# Patient Record
Sex: Female | Born: 1990 | Race: Black or African American | Hispanic: No | Marital: Single | Smoking: Former smoker
Health system: Southern US, Community
[De-identification: ages and names within clinical notes are randomized; demographics above are authoritative.]

## PROBLEM LIST (undated history)

## (undated) ENCOUNTER — Inpatient Hospital Stay (HOSPITAL_COMMUNITY): Payer: Self-pay

## (undated) DIAGNOSIS — K219 Gastro-esophageal reflux disease without esophagitis: Secondary | ICD-10-CM

## (undated) DIAGNOSIS — N200 Calculus of kidney: Secondary | ICD-10-CM

## (undated) DIAGNOSIS — Z87442 Personal history of urinary calculi: Secondary | ICD-10-CM

## (undated) DIAGNOSIS — N201 Calculus of ureter: Secondary | ICD-10-CM

## (undated) DIAGNOSIS — Z973 Presence of spectacles and contact lenses: Secondary | ICD-10-CM

## (undated) DIAGNOSIS — R350 Frequency of micturition: Secondary | ICD-10-CM

---

## 2012-11-11 ENCOUNTER — Inpatient Hospital Stay (HOSPITAL_COMMUNITY)
Admission: AD | Admit: 2012-11-11 | Discharge: 2012-11-11 | Disposition: A | Discharge status: Home / Self Care | Payer: BC Managed Care – PPO | Source: Ambulatory Visit | Attending: Obstetrics and Gynecology | Admitting: Obstetrics and Gynecology

## 2012-11-11 ENCOUNTER — Encounter (HOSPITAL_COMMUNITY): Payer: Self-pay | Admitting: *Deleted

## 2012-11-11 DIAGNOSIS — O209 Hemorrhage in early pregnancy, unspecified: Secondary | ICD-10-CM

## 2012-11-11 DIAGNOSIS — O0932 Supervision of pregnancy with insufficient antenatal care, second trimester: Secondary | ICD-10-CM

## 2012-11-11 LAB — WET PREP, GENITAL
Trich, Wet Prep: NONE SEEN
Yeast Wet Prep HPF POC: NONE SEEN

## 2012-11-11 NOTE — MAU Provider Note (Signed)
Attestation of Attending Supervision of Advanced Practitioner (CNM/NP): Evaluation and management procedures were performed by the Advanced Practitioner under my supervision and collaboration.  I have reviewed the Advanced Practitioner's note and chart, and I agree with the management and plan.  Erin Jefferson,Erin Jefferson 11/11/2012 11:28 AM

## 2012-11-11 NOTE — MAU Note (Signed)
Pt reports she started having some vaginal bleeding last night when she wiped. Still having bleeding this morning denies pain or cramping. Last intercourse Thursday.

## 2012-11-11 NOTE — MAU Provider Note (Signed)
Chief Complaint  Patient presents with  . Vaginal Bleeding    Subjective Erin Jefferson 21 y.o.  G2P1001 at [redacted]w[redacted]d by LMP presents with onset last night of first episode of small amount pink vaginal bleeding. No abdominal pain. Last intercourse 2 nights ago.  Denies abnormal vaginal discharge or irritation. No dysuria or hematuria.  Blood type: unknown NPC  Problem list, past medical history, Ob/Gyn history, surgical history, family history and social history reviewed and updated as appropriate. Pertinent Medical History: [place] Pertinent Ob/Gyn History: [facility]VD w/o complications ([facility]) Pertinent Surgical History: none  Prescriptions prior to admission  Medication Sig Dispense Refill  . prenatal vitamin w/FE, FA (NATACHEW) 29-1 MG CHEW Chew 1 tablet by mouth daily at 12 noon.        No Known Allergies   Objective   Filed Vitals:   11/11/12 0934  BP: 118/67  Pulse: 86  Resp: 18     Physical Exam General: WN/WD in NAD  Abdom: soft, NT DT: 140 External genitalia: normal; BUS neg  SSE: small amount pink blood; cervix with no lesions, appears closed Bimanual: Cervix closed, long; uterus anteverted, NT, 14-16 weeks size; adnexa nontender, no masses  SVE: L/C/H  Lab Results Results for orders placed during the hospital encounter of 11/11/12 (from the past 24 hour(s))  WET PREP, GENITAL     Status: Abnormal   Collection Time    11/11/12 10:02 AM      Result Value Range   Yeast Wet Prep HPF POC NONE SEEN  NONE SEEN   Trich, Wet Prep NONE SEEN  NONE SEEN   Clue Cells Wet Prep HPF POC FEW (*) NONE SEEN   WBC, Wet Prep HPF POC MODERATE (*) NONE SEEN  ABO/RH     Status: None   Collection Time    11/11/12 10:20 AM      Result Value Range   ABO/RH(D) O POS      Ultrasound  No results found.  Assessment 1. Bleeding in early pregnancy   G2P1001 at [redacted]w[redacted]d EGA, probably post-coital spotting   Plan    GC/CT sent. Outpatient [place] scheduled Discharge home See AVS for pt education  EPB and pelvic rest until NOB    Medication List    TAKE these medications       prenatal vitamin w/FE, FA 29-1 MG Chew  Chew 1 tablet by mouth daily at 12 noon.         Follow-up Information   Schedule an appointment as soon as possible for a visit with [facility] [place]. (Call [facility] or choose provider from list below)    Contact information:   [address] [place] [address] [phone]       Erin Jefferson 11/11/2012 9:55 AM

## 2012-11-15 ENCOUNTER — Other Ambulatory Visit (HOSPITAL_COMMUNITY): Payer: Self-pay | Admitting: Obstetrics and Gynecology

## 2012-11-15 ENCOUNTER — Ambulatory Visit (HOSPITAL_COMMUNITY)
Admission: RE | Admit: 2012-11-15 | Discharge: 2012-11-15 | Disposition: A | Discharge status: Home / Self Care | Payer: BC Managed Care – PPO | Source: Ambulatory Visit | Attending: Obstetrics and Gynecology | Admitting: Obstetrics and Gynecology

## 2012-11-15 DIAGNOSIS — Z3689 Encounter for other specified antenatal screening: Secondary | ICD-10-CM | POA: Insufficient documentation

## 2012-11-15 DIAGNOSIS — O209 Hemorrhage in early pregnancy, unspecified: Secondary | ICD-10-CM

## 2012-11-15 DIAGNOSIS — O0930 Supervision of pregnancy with insufficient antenatal care, unspecified trimester: Secondary | ICD-10-CM | POA: Insufficient documentation

## 2013-01-03 ENCOUNTER — Other Ambulatory Visit: Payer: Self-pay | Admitting: Obstetrics and Gynecology

## 2013-01-03 ENCOUNTER — Other Ambulatory Visit (HOSPITAL_COMMUNITY)
Admission: RE | Admit: 2013-01-03 | Discharge: 2013-01-03 | Disposition: A | Discharge status: Home / Self Care | Payer: Medicaid Other | Source: Ambulatory Visit | Attending: Obstetrics and Gynecology | Admitting: Obstetrics and Gynecology

## 2013-01-03 DIAGNOSIS — Z01419 Encounter for gynecological examination (general) (routine) without abnormal findings: Secondary | ICD-10-CM | POA: Insufficient documentation

## 2013-01-03 DIAGNOSIS — Z113 Encounter for screening for infections with a predominantly sexual mode of transmission: Secondary | ICD-10-CM | POA: Insufficient documentation

## 2013-01-03 LAB — OB RESULTS CONSOLE GC/CHLAMYDIA
Chlamydia: NEGATIVE
Gonorrhea: NEGATIVE

## 2013-01-03 LAB — OB RESULTS CONSOLE HIV ANTIBODY (ROUTINE TESTING): HIV: NONREACTIVE

## 2013-01-03 LAB — OB RESULTS CONSOLE HEPATITIS B SURFACE ANTIGEN: Hepatitis B Surface Ag: NEGATIVE

## 2013-02-06 ENCOUNTER — Encounter: Payer: Self-pay | Admitting: Obstetrics

## 2013-04-04 LAB — OB RESULTS CONSOLE GBS: GBS: POSITIVE

## 2013-04-29 ENCOUNTER — Encounter (HOSPITAL_COMMUNITY)
Admission: AD | Disposition: A | Discharge status: Home / Self Care | Payer: Self-pay | Source: Ambulatory Visit | Attending: Obstetrics and Gynecology

## 2013-04-29 ENCOUNTER — Inpatient Hospital Stay (HOSPITAL_COMMUNITY): Payer: Medicaid Other | Admitting: Certified Registered"

## 2013-04-29 ENCOUNTER — Inpatient Hospital Stay (HOSPITAL_COMMUNITY)
Admission: AD | Admit: 2013-04-29 | Discharge: 2013-05-02 | DRG: 766 | Disposition: A | Discharge status: Home / Self Care | Payer: Medicaid Other | Source: Ambulatory Visit | Attending: Obstetrics and Gynecology | Admitting: Obstetrics and Gynecology

## 2013-04-29 ENCOUNTER — Encounter (HOSPITAL_COMMUNITY): Payer: Self-pay

## 2013-04-29 ENCOUNTER — Encounter (HOSPITAL_COMMUNITY): Payer: Self-pay | Admitting: Certified Registered"

## 2013-04-29 DIAGNOSIS — O99892 Other specified diseases and conditions complicating childbirth: Secondary | ICD-10-CM | POA: Diagnosis present

## 2013-04-29 DIAGNOSIS — O98519 Other viral diseases complicating pregnancy, unspecified trimester: Principal | ICD-10-CM | POA: Diagnosis present

## 2013-04-29 DIAGNOSIS — Z22330 Carrier of Group B streptococcus: Secondary | ICD-10-CM

## 2013-04-29 DIAGNOSIS — A6000 Herpesviral infection of urogenital system, unspecified: Secondary | ICD-10-CM | POA: Diagnosis present

## 2013-04-29 DIAGNOSIS — Z98891 History of uterine scar from previous surgery: Secondary | ICD-10-CM

## 2013-04-29 DIAGNOSIS — B009 Herpesviral infection, unspecified: Secondary | ICD-10-CM

## 2013-04-29 LAB — CBC
HCT: 37.2 % (ref 36.0–46.0)
Hemoglobin: 12.9 g/dL (ref 12.0–15.0)
WBC: 5.5 10*3/uL (ref 4.0–10.5)

## 2013-04-29 LAB — TYPE AND SCREEN: Antibody Screen: NEGATIVE

## 2013-04-29 SURGERY — Surgical Case
Anesthesia: Spinal | Site: Abdomen | Wound class: Clean Contaminated

## 2013-04-29 MED ORDER — IBUPROFEN 600 MG PO TABS
600.0000 mg | ORAL_TABLET | Freq: Four times a day (QID) | ORAL | Status: DC
  Administered 2013-04-29 – 2013-05-02 (×10): 600 mg via ORAL
  Filled 2013-04-29 (×10): qty 1

## 2013-04-29 MED ORDER — OXYCODONE-ACETAMINOPHEN 5-325 MG PO TABS
1.0000 | ORAL_TABLET | ORAL | Status: DC | PRN
Start: 2013-04-29 — End: 2013-04-29

## 2013-04-29 MED ORDER — LACTATED RINGERS IV SOLN: INTRAVENOUS | Status: DC

## 2013-04-29 MED ORDER — SCOPOLAMINE 1 MG/3DAYS TD PT72: 1.0000 | MEDICATED_PATCH | Freq: Once | TRANSDERMAL | Status: AC

## 2013-04-29 MED ORDER — DIPHENHYDRAMINE HCL 50 MG/ML IJ SOLN: 12.5000 mg | INTRAMUSCULAR | Status: DC | PRN

## 2013-04-29 MED ORDER — ONDANSETRON HCL 4 MG/2ML IJ SOLN
INTRAMUSCULAR | Status: DC | PRN
  Administered 2013-04-29: 4 mg via INTRAVENOUS

## 2013-04-29 MED ORDER — EPHEDRINE 5 MG/ML INJ: 10.0000 mg | INTRAVENOUS | Status: DC | PRN

## 2013-04-29 MED ORDER — METOCLOPRAMIDE HCL 5 MG/ML IJ SOLN: 10.0000 mg | Freq: Three times a day (TID) | INTRAMUSCULAR | Status: DC | PRN

## 2013-04-29 MED ORDER — MENTHOL 3 MG MT LOZG: 1.0000 | LOZENGE | OROMUCOSAL | Status: DC | PRN

## 2013-04-29 MED ORDER — PHENYLEPHRINE HCL 10 MG/ML IJ SOLN
INTRAMUSCULAR | Status: DC | PRN
  Administered 2013-04-29: 40 ug via INTRAVENOUS
  Administered 2013-04-29: 80 ug via INTRAVENOUS
  Administered 2013-04-29: 40 ug via INTRAVENOUS
  Administered 2013-04-29: 80 ug via INTRAVENOUS
  Administered 2013-04-29 (×2): 40 ug via INTRAVENOUS

## 2013-04-29 MED ORDER — PRENATAL MULTIVITAMIN CH
1.0000 | ORAL_TABLET | Freq: Every day | ORAL | Status: DC
  Administered 2013-04-30 – 2013-05-02 (×3): 1 via ORAL
  Filled 2013-04-29 (×3): qty 1

## 2013-04-29 MED ORDER — LIDOCAINE HCL (PF) 1 % IJ SOLN: 30.0000 mL | INTRAMUSCULAR | Status: DC | PRN

## 2013-04-29 MED ORDER — ONDANSETRON HCL 4 MG PO TABS: 4.0000 mg | ORAL_TABLET | ORAL | Status: DC | PRN

## 2013-04-29 MED ORDER — PHENYLEPHRINE 40 MCG/ML (10ML) SYRINGE FOR IV PUSH (FOR BLOOD PRESSURE SUPPORT)
PREFILLED_SYRINGE | INTRAVENOUS | Status: AC
  Filled 2013-04-29: qty 5

## 2013-04-29 MED ORDER — PHENYLEPHRINE 40 MCG/ML (10ML) SYRINGE FOR IV PUSH (FOR BLOOD PRESSURE SUPPORT): 80.0000 ug | PREFILLED_SYRINGE | INTRAVENOUS | Status: DC | PRN

## 2013-04-29 MED ORDER — NALOXONE HCL 1 MG/ML IJ SOLN: 1.0000 ug/kg/h | INTRAVENOUS | Status: DC | PRN

## 2013-04-29 MED ORDER — LACTATED RINGERS IV SOLN: 500.0000 mL | Freq: Once | INTRAVENOUS | Status: DC

## 2013-04-29 MED ORDER — FERROUS SULFATE 325 (65 FE) MG PO TABS
325.0000 mg | ORAL_TABLET | Freq: Two times a day (BID) | ORAL | Status: DC
  Administered 2013-04-30 – 2013-05-02 (×5): 325 mg via ORAL
  Filled 2013-04-29 (×5): qty 1

## 2013-04-29 MED ORDER — DIBUCAINE 1 % RE OINT: 1.0000 "application " | TOPICAL_OINTMENT | RECTAL | Status: DC | PRN

## 2013-04-29 MED ORDER — MORPHINE SULFATE (PF) 0.5 MG/ML IJ SOLN
INTRAMUSCULAR | Status: DC | PRN
  Administered 2013-04-29: .1 mg via INTRATHECAL

## 2013-04-29 MED ORDER — OXYTOCIN BOLUS FROM INFUSION: 500.0000 mL | INTRAVENOUS | Status: DC

## 2013-04-29 MED ORDER — BUPIVACAINE IN DEXTROSE 0.75-8.25 % IT SOLN
INTRATHECAL | Status: DC | PRN
  Administered 2013-04-29: 1.6 mL via INTRATHECAL

## 2013-04-29 MED ORDER — DIPHENHYDRAMINE HCL 50 MG/ML IJ SOLN: 25.0000 mg | INTRAMUSCULAR | Status: DC | PRN

## 2013-04-29 MED ORDER — CITRIC ACID-SODIUM CITRATE 334-500 MG/5ML PO SOLN
30.0000 mL | ORAL | Status: DC | PRN
  Administered 2013-04-29: 30 mL via ORAL
  Filled 2013-04-29: qty 15

## 2013-04-29 MED ORDER — OXYTOCIN 40 UNITS IN LACTATED RINGERS INFUSION - SIMPLE MED: 62.5000 mL/h | INTRAVENOUS | Status: DC

## 2013-04-29 MED ORDER — METHYLERGONOVINE MALEATE 0.2 MG PO TABS: 0.2000 mg | ORAL_TABLET | ORAL | Status: DC | PRN

## 2013-04-29 MED ORDER — OXYTOCIN 10 UNIT/ML IJ SOLN
INTRAMUSCULAR | Status: AC
  Filled 2013-04-29: qty 4

## 2013-04-29 MED ORDER — EPHEDRINE SULFATE 50 MG/ML IJ SOLN
INTRAMUSCULAR | Status: DC | PRN
  Administered 2013-04-29: 10 mg via INTRAVENOUS
  Administered 2013-04-29: 20 mg via INTRAVENOUS
  Administered 2013-04-29: 10 mg via INTRAVENOUS

## 2013-04-29 MED ORDER — MEASLES, MUMPS & RUBELLA VAC ~~LOC~~ INJ: 0.5000 mL | INJECTION | Freq: Once | SUBCUTANEOUS | Status: DC

## 2013-04-29 MED ORDER — WITCH HAZEL-GLYCERIN EX PADS: 1.0000 "application " | MEDICATED_PAD | CUTANEOUS | Status: DC | PRN

## 2013-04-29 MED ORDER — ONDANSETRON HCL 4 MG/2ML IJ SOLN
INTRAMUSCULAR | Status: AC
  Filled 2013-04-29: qty 2

## 2013-04-29 MED ORDER — IBUPROFEN 600 MG PO TABS: 600.0000 mg | ORAL_TABLET | Freq: Four times a day (QID) | ORAL | Status: DC | PRN

## 2013-04-29 MED ORDER — ONDANSETRON HCL 4 MG/2ML IJ SOLN: 4.0000 mg | Freq: Four times a day (QID) | INTRAMUSCULAR | Status: DC | PRN

## 2013-04-29 MED ORDER — KETOROLAC TROMETHAMINE 30 MG/ML IJ SOLN: 30.0000 mg | Freq: Four times a day (QID) | INTRAMUSCULAR | Status: AC | PRN

## 2013-04-29 MED ORDER — MAGNESIUM HYDROXIDE 400 MG/5ML PO SUSP: 30.0000 mL | ORAL | Status: DC | PRN

## 2013-04-29 MED ORDER — DIPHENHYDRAMINE HCL 25 MG PO CAPS: 25.0000 mg | ORAL_CAPSULE | Freq: Four times a day (QID) | ORAL | Status: DC | PRN

## 2013-04-29 MED ORDER — PENICILLIN G POTASSIUM 5000000 UNITS IJ SOLR
5.0000 10*6.[IU] | Freq: Once | INTRAVENOUS | Status: AC
  Administered 2013-04-29: 5 10*6.[IU] via INTRAVENOUS
  Filled 2013-04-29: qty 5

## 2013-04-29 MED ORDER — DEXTROSE 5 % IV SOLN
2.0000 g | INTRAVENOUS | Status: AC
  Administered 2013-04-29: 2 g via INTRAVENOUS
  Filled 2013-04-29: qty 2

## 2013-04-29 MED ORDER — MEPERIDINE HCL 25 MG/ML IJ SOLN: 6.2500 mg | INTRAMUSCULAR | Status: DC | PRN

## 2013-04-29 MED ORDER — TETANUS-DIPHTH-ACELL PERTUSSIS 5-2.5-18.5 LF-MCG/0.5 IM SUSP
0.5000 mL | Freq: Once | INTRAMUSCULAR | Status: AC
  Administered 2013-05-02: 0.5 mL via INTRAMUSCULAR

## 2013-04-29 MED ORDER — KETOROLAC TROMETHAMINE 60 MG/2ML IM SOLN
INTRAMUSCULAR | Status: AC
  Administered 2013-04-29: 60 mg via INTRAMUSCULAR
  Filled 2013-04-29: qty 2

## 2013-04-29 MED ORDER — FENTANYL CITRATE 0.05 MG/ML IJ SOLN: 25.0000 ug | INTRAMUSCULAR | Status: DC | PRN

## 2013-04-29 MED ORDER — NALBUPHINE HCL 10 MG/ML IJ SOLN
5.0000 mg | INTRAMUSCULAR | Status: DC | PRN
  Filled 2013-04-29: qty 1

## 2013-04-29 MED ORDER — SODIUM CHLORIDE 0.9 % IJ SOLN: 3.0000 mL | INTRAMUSCULAR | Status: DC | PRN

## 2013-04-29 MED ORDER — LACTATED RINGERS IV SOLN
INTRAVENOUS | Status: DC
  Administered 2013-04-29 (×3): via INTRAVENOUS

## 2013-04-29 MED ORDER — MORPHINE SULFATE 0.5 MG/ML IJ SOLN
INTRAMUSCULAR | Status: AC
  Filled 2013-04-29: qty 10

## 2013-04-29 MED ORDER — KETOROLAC TROMETHAMINE 60 MG/2ML IM SOLN: 60.0000 mg | Freq: Once | INTRAMUSCULAR | Status: AC | PRN

## 2013-04-29 MED ORDER — SCOPOLAMINE 1 MG/3DAYS TD PT72
MEDICATED_PATCH | TRANSDERMAL | Status: AC
  Administered 2013-04-29: 1.5 mg via TRANSDERMAL
  Filled 2013-04-29: qty 1

## 2013-04-29 MED ORDER — LANOLIN HYDROUS EX OINT: 1.0000 "application " | TOPICAL_OINTMENT | CUTANEOUS | Status: DC | PRN

## 2013-04-29 MED ORDER — PENICILLIN G POTASSIUM 5000000 UNITS IJ SOLR
2.5000 10*6.[IU] | INTRAVENOUS | Status: DC
  Filled 2013-04-29 (×4): qty 2.5

## 2013-04-29 MED ORDER — FENTANYL CITRATE 0.05 MG/ML IJ SOLN
INTRAMUSCULAR | Status: DC | PRN
  Administered 2013-04-29: 12.5 ug via INTRAVENOUS
  Administered 2013-04-29: 25 ug via INTRAVENOUS
  Administered 2013-04-29: 12.5 ug via INTRAVENOUS
  Administered 2013-04-29: 25 ug via INTRAVENOUS
  Administered 2013-04-29: 12.5 ug via INTRAVENOUS
  Administered 2013-04-29: 12.5 ug via INTRATHECAL

## 2013-04-29 MED ORDER — FENTANYL CITRATE 0.05 MG/ML IJ SOLN
INTRAMUSCULAR | Status: AC
  Filled 2013-04-29: qty 2

## 2013-04-29 MED ORDER — NALOXONE HCL 0.4 MG/ML IJ SOLN: 0.4000 mg | INTRAMUSCULAR | Status: DC | PRN

## 2013-04-29 MED ORDER — DIPHENHYDRAMINE HCL 25 MG PO CAPS: 25.0000 mg | ORAL_CAPSULE | ORAL | Status: DC | PRN

## 2013-04-29 MED ORDER — LACTATED RINGERS IV SOLN: 500.0000 mL | INTRAVENOUS | Status: DC | PRN

## 2013-04-29 MED ORDER — SIMETHICONE 80 MG PO CHEW: 80.0000 mg | CHEWABLE_TABLET | ORAL | Status: DC | PRN

## 2013-04-29 MED ORDER — SIMETHICONE 80 MG PO CHEW
80.0000 mg | CHEWABLE_TABLET | Freq: Three times a day (TID) | ORAL | Status: DC
Start: 2013-04-29 — End: 2013-05-02
  Administered 2013-04-29 – 2013-05-02 (×11): 80 mg via ORAL

## 2013-04-29 MED ORDER — ONDANSETRON HCL 4 MG/2ML IJ SOLN: 4.0000 mg | Freq: Three times a day (TID) | INTRAMUSCULAR | Status: DC | PRN

## 2013-04-29 MED ORDER — METHYLERGONOVINE MALEATE 0.2 MG/ML IJ SOLN
0.2000 mg | INTRAMUSCULAR | Status: DC | PRN
Start: 2013-04-29 — End: 2013-05-02

## 2013-04-29 MED ORDER — FENTANYL 2.5 MCG/ML BUPIVACAINE 1/10 % EPIDURAL INFUSION (WH - ANES): 14.0000 mL/h | INTRAMUSCULAR | Status: DC | PRN

## 2013-04-29 MED ORDER — OXYCODONE-ACETAMINOPHEN 5-325 MG PO TABS
1.0000 | ORAL_TABLET | ORAL | Status: DC | PRN
  Filled 2013-04-29: qty 1

## 2013-04-29 MED ORDER — EPHEDRINE 5 MG/ML INJ
INTRAVENOUS | Status: AC
  Filled 2013-04-29: qty 10

## 2013-04-29 MED ORDER — ZOLPIDEM TARTRATE 5 MG PO TABS: 5.0000 mg | ORAL_TABLET | Freq: Every evening | ORAL | Status: DC | PRN

## 2013-04-29 MED ORDER — 0.9 % SODIUM CHLORIDE (POUR BTL) OPTIME
TOPICAL | Status: DC | PRN
  Administered 2013-04-29: 1000 mL

## 2013-04-29 MED ORDER — LACTATED RINGERS IV SOLN
INTRAVENOUS | Status: DC
  Administered 2013-04-29: 22:00:00 via INTRAVENOUS

## 2013-04-29 MED ORDER — ACETAMINOPHEN 325 MG PO TABS: 650.0000 mg | ORAL_TABLET | ORAL | Status: DC | PRN

## 2013-04-29 MED ORDER — BUTORPHANOL TARTRATE 1 MG/ML IJ SOLN: 1.0000 mg | INTRAMUSCULAR | Status: DC | PRN

## 2013-04-29 MED ORDER — PROMETHAZINE HCL 25 MG/ML IJ SOLN: 6.2500 mg | INTRAMUSCULAR | Status: DC | PRN

## 2013-04-29 MED ORDER — OXYTOCIN 10 UNIT/ML IJ SOLN
40.0000 [IU] | INTRAVENOUS | Status: DC | PRN
  Administered 2013-04-29: 40 [IU] via INTRAVENOUS

## 2013-04-29 MED ORDER — SENNOSIDES-DOCUSATE SODIUM 8.6-50 MG PO TABS
2.0000 | ORAL_TABLET | Freq: Every day | ORAL | Status: DC
Start: 2013-04-29 — End: 2013-05-02
  Administered 2013-04-29 – 2013-05-01 (×3): 2 via ORAL

## 2013-04-29 MED ORDER — ONDANSETRON HCL 4 MG/2ML IJ SOLN: 4.0000 mg | INTRAMUSCULAR | Status: DC | PRN

## 2013-04-29 MED ORDER — OXYTOCIN 40 UNITS IN LACTATED RINGERS INFUSION - SIMPLE MED: 62.5000 mL/h | INTRAVENOUS | Status: AC

## 2013-04-29 SURGICAL SUPPLY — 34 items
BARRIER ADHS 3X4 INTERCEED (GAUZE/BANDAGES/DRESSINGS) ×2 IMPLANT
BENZOIN TINCTURE PRP APPL 2/3 (GAUZE/BANDAGES/DRESSINGS) ×2 IMPLANT
CLAMP CORD UMBIL (MISCELLANEOUS) IMPLANT
CLOTH BEACON ORANGE TIMEOUT ST (SAFETY) ×2 IMPLANT
DERMABOND ADVANCED (GAUZE/BANDAGES/DRESSINGS)
DERMABOND ADVANCED .7 DNX12 (GAUZE/BANDAGES/DRESSINGS) IMPLANT
DRAPE LG THREE QUARTER DISP (DRAPES) ×2 IMPLANT
DRSG OPSITE POSTOP 4X10 (GAUZE/BANDAGES/DRESSINGS) ×2 IMPLANT
DURAPREP 26ML APPLICATOR (WOUND CARE) ×2 IMPLANT
ELECT REM PT RETURN 9FT ADLT (ELECTROSURGICAL) ×2
ELECTRODE REM PT RTRN 9FT ADLT (ELECTROSURGICAL) ×1 IMPLANT
EXTRACTOR VACUUM BELL STYLE (SUCTIONS) IMPLANT
GLOVE BIO SURGEON STRL SZ7 (GLOVE) ×2 IMPLANT
GLOVE BIOGEL PI IND STRL 7.0 (GLOVE) ×3 IMPLANT
GLOVE BIOGEL PI INDICATOR 7.0 (GLOVE) ×3
GOWN PREVENTION PLUS XLARGE (GOWN DISPOSABLE) IMPLANT
GOWN STRL REIN XL XLG (GOWN DISPOSABLE) ×4 IMPLANT
KIT ABG SYR 3ML LUER SLIP (SYRINGE) IMPLANT
NEEDLE HYPO 25X5/8 SAFETYGLIDE (NEEDLE) IMPLANT
NS IRRIG 1000ML POUR BTL (IV SOLUTION) ×2 IMPLANT
PACK C SECTION WH (CUSTOM PROCEDURE TRAY) ×2 IMPLANT
PAD OB MATERNITY 4.3X12.25 (PERSONAL CARE ITEMS) ×2 IMPLANT
RTRCTR C-SECT PINK 25CM LRG (MISCELLANEOUS) ×2 IMPLANT
STRIP CLOSURE SKIN 1/2X4 (GAUZE/BANDAGES/DRESSINGS) ×2 IMPLANT
SUT CHROMIC 0 CTX 36 (SUTURE) ×6 IMPLANT
SUT PLAIN 2 0 (SUTURE)
SUT PLAIN 2 0 XLH (SUTURE) ×2 IMPLANT
SUT PLAIN ABS 2-0 54XMFL TIE (SUTURE) IMPLANT
SUT VIC AB 0 CT1 27 (SUTURE) ×1
SUT VIC AB 0 CT1 27XBRD ANBCTR (SUTURE) ×1 IMPLANT
SUT VIC AB 4-0 KS 27 (SUTURE) ×2 IMPLANT
TOWEL OR 17X24 6PK STRL BLUE (TOWEL DISPOSABLE) ×2 IMPLANT
TRAY FOLEY CATH 14FR (SET/KITS/TRAYS/PACK) IMPLANT
WATER STERILE IRR 1000ML POUR (IV SOLUTION) ×2 IMPLANT

## 2013-04-29 NOTE — Anesthesia Preprocedure Evaluation (Signed)
Anesthesia Evaluation  Patient identified by MRN, date of birth, ID band Patient awake    Reviewed: Allergy & Precautions, H&P , NPO status , Patient's Chart, lab work & pertinent test results  Airway Mallampati: II TM Distance: >3 FB Neck ROM: Full    Dental no notable dental hx.    Pulmonary neg pulmonary ROS,  breath sounds clear to auscultation  Pulmonary exam normal       Cardiovascular negative cardio ROS  Rhythm:Regular Rate:Normal     Neuro/Psych negative neurological ROS  negative psych ROS   GI/Hepatic negative GI ROS, Neg liver ROS,   Endo/Other  negative endocrine ROS  Renal/GU negative Renal ROS  negative genitourinary   Musculoskeletal negative musculoskeletal ROS (+)   Abdominal   Peds negative pediatric ROS (+)  Hematology negative hematology ROS (+)   Anesthesia Other Findings   Reproductive/Obstetrics negative OB ROS                           Anesthesia Physical Anesthesia Plan  ASA: II  Anesthesia Plan: Spinal   Post-op Pain Management:    Induction:   Airway Management Planned:   Additional Equipment:   Intra-op Plan:   Post-operative Plan:   Informed Consent: I have reviewed the patients History and Physical, chart, labs and discussed the procedure including the risks, benefits and alternatives for the proposed anesthesia with the patient or authorized representative who has indicated his/her understanding and acceptance.   Dental advisory given  Plan Discussed with: CRNA  Anesthesia Plan Comments:         Anesthesia Quick Evaluation  

## 2013-04-29 NOTE — MAU Note (Signed)
Leaking fld since -clear fld. Contractions for couple days but closer and stronger since leaking started

## 2013-04-29 NOTE — MAU Provider Note (Signed)
S: 22 y.o. G2P1001 @[redacted]w[redacted]d  presents to MAU with leakage of clear fluid since midnight that soaked her underwear x2 but did not require a pad.  She reports good fetal movement, denies regular contractions, LOF, vaginal bleeding, vaginal itching/burning, urinary symptoms, h/a, dizziness, n/v, or fever/chills.    O: BP 106/67  Pulse 73  Temp(Src) 97.6 F (36.4 C)  Resp 22  Ht 5\' 3"  (1.6 m)  Wt 76.93 kg (169 lb 9.6 oz)  BMI 30.05 kg/m2  LMP 07/29/2012  Dilation: 4 Effacement (%): 50 Cervical Position: Posterior Station: -3 Presentation: Vertex Exam by:: Leftwich, CNM   Pooling slightly positive with significant mucous but shiny clear fluid present when pt bears down  Ferning positive  A: SROM at term  P: RN to Call Dr Dion Body Admit to birthing suites  Sharen Counter Certified Nurse-Midwife

## 2013-04-29 NOTE — H&P (Addendum)
Erin Jefferson is a 22 y.o. female G2 P1001 at 23 6/7 weeks by LMP confirmed with a 22 week ultrasound presenting for c/o LOF.  Also having contractions.  Contractions are mild.  Denies vaginal bleeding. Pt was late to prenatal care with Dr. Gerald Leitz.  Started at 22 weeks.  Pt's partner has a h/o HSV.  She was tested by serology and positive antibodies noted.  Pt states she never had an outbreak.  She would have occasional itching on the perineum "every blue moon."  Denies symptoms currently.     Maternal Medical History:  Reason for admission: Rupture of membranes and contractions.   Contractions: Onset was 6-12 hours ago.   Frequency: irregular.   Perceived severity is mild.    Prenatal complications: Diagnosed with HSV.  Prenatal Complications - Diabetes: none.    OB History   Grav Para Term Preterm Abortions TAB SAB Ect Mult Living   2 1 1       1      Past Medical History  Diagnosis Date  . Medical history non-contributory    Past Surgical History  Procedure Laterality Date  . No past surgeries     Family History: family history is not on file. Social History:  reports that she has never smoked. She has never used smokeless tobacco. She reports that she does not drink alcohol or use illicit drugs.   Prenatal Transfer Tool  Maternal Diabetes: No Genetic Screening: Declined  Late to prenatal care. Maternal Ultrasounds/Referrals: Normal Fetal Ultrasounds or other Referrals:  None Maternal Substance Abuse:  No Significant Maternal Medications:  Meds include: Other: Valtrex Significant Maternal Lab Results:  Lab values include: Group B Strep positive Other Comments:  HSV lesion right labia.  Review of Systems  Gastrointestinal: Positive for abdominal pain.  Genitourinary:       Leakage of fluid.    Dilation: 4 Effacement (%): 50 Station: -3 Exam by:: Leftwich, CNM  Blood pressure 106/59, pulse 88, temperature 98.4 F (36.9 C), temperature source Oral, resp.  rate 18, height 5\' 3"  (1.6 m), weight 76.658 kg (169 lb), last menstrual period 07/29/2012. Maternal Exam:  Abdomen: Fetal presentation: vertex  Introitus: Vulva is positive for lesion. Normal vagina.  Amniotic fluid character: clear. 1 cm ulceration on right labia, tender to palpation, smaller ulceration above it.  Cervix: Cervix evaluated by sterile speculum exam.     Fetal Exam Fetal Monitor Review: Mode: fetoscope.   Pattern: accelerations present.    Fetal State Assessment: Category I - tracings are normal.     Physical Exam  Constitutional: She is oriented to person, place, and time. She appears well-developed and well-nourished. No distress.  HENT:  Head: Normocephalic and atraumatic.  Eyes: EOM are normal.  Neck: Normal range of motion.  Respiratory: Effort normal.  GI: There is no tenderness.  Genitourinary: Vagina normal. Vulva exhibits lesion.  Musculoskeletal: She exhibits edema.  Neurological: She is alert and oriented to person, place, and time.  Skin: Skin is warm and dry. She is not diaphoretic.  Psychiatric: She has a normal mood and affect.  Very calm when csection recommended.    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 Recommend proceeding with primary c-section.  R/B/A and rationale reviewed with pt.  All  questions answered.  Consent obtained.   Geryl Rankins 04/29/2013, 1:56 PM

## 2013-04-29 NOTE — MAU Note (Signed)
Pt states since MN has had clear lof, non-odorous, did note pink tinge in beginning. Was 2cm/50% Thursday per Dr. Richardson Dopp. Panties were wet however was not wearing a pad in MAU.

## 2013-04-29 NOTE — Transfer of Care (Signed)
Immediate Anesthesia Transfer of Care Note  Patient: Erin Jefferson  Procedure(s) Performed: Procedure(s): Primary cesarean section with delivery of baby boy at 27. Apgars 9/9. (N/A)  Patient Location: PACU  Anesthesia Type:Spinal  Level of Consciousness: awake, alert  and oriented  Airway & Oxygen Therapy: Patient Spontanous Breathing  Post-op Assessment: Report given to PACU RN  Post vital signs: Reviewed and stable  Complications: No apparent anesthesia complications

## 2013-04-29 NOTE — Anesthesia Procedure Notes (Signed)
Spinal Patient location during procedure: OR Staffing Anesthesiologist: Ashish Rossetti Performed by: anesthesiologist  Preanesthetic Checklist Completed: patient identified, site marked, surgical consent, pre-op evaluation, timeout performed, IV checked, risks and benefits discussed and monitors and equipment checked Spinal Block Patient position: sitting Prep: ChloraPrep Patient monitoring: heart rate, continuous pulse ox and blood pressure Approach: midline Location: L4-5 Injection technique: single-shot Needle Needle type: Sprotte  Needle gauge: 24 G Needle length: 9 cm Additional Notes Expiration date of kit checked and confirmed. Patient tolerated procedure well, without complications.     

## 2013-04-29 NOTE — Preoperative (Signed)
Beta Blockers   Reason not to administer Beta Blockers:Not Applicable 

## 2013-04-29 NOTE — Anesthesia Postprocedure Evaluation (Signed)
  Anesthesia Post-op Note  Patient: Erin Jefferson  Procedure(s) Performed: Procedure(s) (LRB): Primary cesarean section with delivery of baby boy at 39. Apgars 9/9. (N/A)  Patient Location: PACU  Anesthesia Type: Spinal  Level of Consciousness: awake and alert   Airway and Oxygen Therapy: Patient Spontanous Breathing  Post-op Pain: mild  Post-op Assessment: Post-op Vital signs reviewed, Patient's Cardiovascular Status Stable, Respiratory Function Stable, Patent Airway and No signs of Nausea or vomiting  Last Vitals:  Filed Vitals:   04/29/13 1615  BP: 114/64  Pulse: 89  Temp:   Resp: 18    Post-op Vital Signs: stable   Complications: No apparent anesthesia complications

## 2013-04-29 NOTE — Brief Op Note (Signed)
04/29/2013  3:44 PM  PATIENT:  Erin Jefferson  22 y.o. female  PRE-OPERATIVE DIAGNOSIS:  Pregnancy at 45 6/7 weeks, positive herpes simplex -active lesion, ROM, Labor, GBS+  POST-OPERATIVE DIAGNOSIS:  positive herpes simplex-active lesion  PROCEDURE:  Procedure(s): Primary cesarean section with delivery of baby boy at 30. Apgars 9/9. (N/A)  SURGEON:  Surgeon(s) and Role:    * Geryl Rankins, MD - Primary  PHYSICIAN ASSISTANT: None  ASSISTANTS: Technician   ANESTHESIA:   spinal  EBL:  Total I/O In: 2000 [I.V.:2000] Out: 1150 [Blood:1150]  BLOOD ADMINISTERED:none  DRAINS: Urinary Catheter (Foley)   LOCAL MEDICATIONS USED:  NONE  SPECIMEN:  No Specimen  DISPOSITION OF SPECIMEN:  N/A  COUNTS:  YES  TOURNIQUET:  * No tourniquets in log *  DICTATION: .Other Dictation: Dictation Number [id number]  PLAN OF CARE: Admit to inpatient   PATIENT DISPOSITION:  PACU - hemodynamically stable.   Delay start of Pharmacological VTE agent (>24hrs) due to surgical blood loss or risk of bleeding: not applicable

## 2013-04-29 NOTE — OR Nursing (Addendum)
Uterus massaged by S. Lashunta Frieden RN .  40cc of blood evacuated from uterus during uterine massage. Two tubes of cord blood sent to lab.. 

## 2013-04-30 ENCOUNTER — Encounter (HOSPITAL_COMMUNITY): Payer: Self-pay | Admitting: Obstetrics and Gynecology

## 2013-04-30 LAB — CBC
Hemoglobin: 11.1 g/dL — ABNORMAL LOW (ref 12.0–15.0)
MCH: 30 pg (ref 26.0–34.0)
MCHC: 34.2 g/dL (ref 30.0–36.0)
MCV: 87.8 fL (ref 78.0–100.0)
RBC: 3.7 MIL/uL — ABNORMAL LOW (ref 3.87–5.11)

## 2013-04-30 NOTE — Lactation Note (Signed)
This note was copied from the chart of Erin Virgene Tirone. Lactation Consultation Note: Initial visit with mom. She reports that baby is nursing well- latched on easily. Her first baby never latched well. Baby asleep in bassinet. No questions at present. To call for assist prn. BF brochure given with resources for support after DC.   Patient Name: Erin Jefferson [id number] Date: 04/30/2013 Reason for consult: Initial assessment   Maternal Data Formula Feeding for Exclusion: Yes Reason for exclusion: Mother's choice to formula and breast feed on admission Infant to breast within first hour of birth: Yes Does the patient have breastfeeding experience prior to this delivery?: Yes  Feeding Feeding Type: Breast Milk Length of feed: 15 min  LATCH Score/Interventions                      Lactation Tools Discussed/Used     Consult Status Consult Status: Follow-up Date: 05/01/13 Follow-up type: In-patient    Pamelia Hoit 04/30/2013, 9:45 AM

## 2013-04-30 NOTE — Progress Notes (Signed)
Subjective: Postpartum Day 1: Cesarean Delivery Patient reports tolerating PO, + flatus and no problems voiding.    Objective: Vital signs in last 24 hours: Temp:  [97.4 F (36.3 C)-99 F (37.2 C)] 97.9 F (36.6 C) (08/25 1210) Pulse Rate:  [65-93] 83 (08/25 1210) Resp:  [16-20] 20 (08/25 1210) BP: (91-119)/(53-92) 101/65 mmHg (08/25 1210) SpO2:  [95 %-100 %] 99 % (08/25 1210)  Physical Exam:  General: alert and cooperative Lochia: appropriate Uterine Fundus: firm Incision: healing well DVT Evaluation: No evidence of DVT seen on physical exam.   Recent Labs  04/29/13 1050 04/30/13 0630  HGB 12.9 11.1*  HCT 37.2 32.5*    Assessment/Plan: Status post Cesarean section. Doing well postoperatively.  Continue current care  pt desires circumcision of infant//r/b/a of circ discussed including but not limited to infection bleeding damage to penis and poor cosmesis with need for further surgery.. Pt is aware that circ is not medically necessary. She voiced understanding and wishes to proceed. Jessee Avers. 04/30/2013, 12:56 PM

## 2013-04-30 NOTE — Op Note (Signed)
NAME:  Erin Jefferson, STINES NO.:  [id number]  MEDICAL RECORD NO.:  [id number]  LOCATION:  [facility]                          FACILITY:  [facility]  PHYSICIAN:  Pieter Partridge, MD   DATE OF BIRTH:  [birth year]  DATE OF PROCEDURE:  04/29/2013 DATE OF DISCHARGE:                              OPERATIVE REPORT   PREOPERATIVE DIAGNOSES:  Pregnancy at 4 and 6/7th weeks, active labor, positive herpes simplex lesions on the perineum, ruptured membranes, and GBS positive.  POSTOPERATIVE DIAGNOSIS:  Pregnancy at 38 and 6/7th weeks, active labor, positive herpes simplex lesions on the perineum, ruptured membranes, and GBS positive.  PROCEDURE:  Primary transverse cesarean section, 2 layer closure.  SURGEON:  Pieter Partridge, MD  ASSISTANT:  Technician.  ANESTHESIA:  Spinal.  EBL:  Around 50.  BLOOD ADMINISTERED:  None.  DRAINS:  Foley.  LOCAL:  None.  SPECIMENS:  None.  PATIENT DISPOSITION:  To PACU, hemodynamically stable.  COMPLICATIONS:  None.  FINDINGS:  Viable female infant in the vertex position.  Clear fluid noted.  No nuchal cord.  Normal uterus, normal endometrial cavity. Apgars were 9 and 9 and weight is pending.  INDICATIONS:  Ms. Lagasse is a  22 year old, gravida 2, para 2-0-0-2, who presented at 61 and 6/7th weeks with a complaint of spontaneous rupture of membranes and contractions, which was 4 cm on arrival.  She was also GBS positive and received penicillin while she was awaiting transfer to the labor and delivery suite.  She had a history of positive herpes by serology.  Her partner was known to have herpes.  The patient was tested on July 30th.  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  The patient denied any outbreaks or any history of outbreaks and also denied any prodromal symptoms.  Upon evaluation of the vagina and perineum, there was hepatic appearing lesion on the right labia at about 9 o'clock, which the patient reported that was a  little burning and tender.  Due to the risk of mortality to the baby, the patient was counseled on proceeding with a cesarean section to avoid that risk. Risks, benefits, and alternatives were discussed and consent obtained.  DESCRIPTION OF PROCEDURE:  The patient was then taken to the operating room, where she was underwent spinal anesthesia without complication. While she was in the dorsal supine position, an HSV culture was collected.  She was then prepped and draped in normal sterile fashion. Foley catheter was placed.  After adequate anesthesia was confirmed, the abdomen was marked for a Pfannenstiel skin incision.  The incision was then made with a scalpel in the skin and carried down to the underlying layers of the fascia with the Bovie.  She did have some large subcutaneous vessels that were cauterized via hemostat and Bovie cautery.  The fascia was then incised with the Bovie and extended laterally with the curved Mayo scissors. The rectus muscles were dissected off of the fascia sharply while tenting up with the Kocher clamps and transected with the Kocher scissors.  Same was done on the bottom.  The rectus muscles were well-approximated.  They were then separated at the midline with a hemostat.  The  peritoneum was then identified and entered sharply and stretched.  The muscles were easily separated at the midline and peritoneal opening was extended unless needed inferiorly. The abdomen was palpated internally.  The Alexis retractor was then placed and palpated to make sure there was nothing between the [place] and the abdomen.  There was not.  The lower uterine segment was identified, the serosa was tented up with a [place] and entered sharply with the Metzenbaum scissors to make a bladder flap.  Transverse incision was then made in the lower uterine segment with the scalpel and extended bluntly.  The occiput was brought easily to the incision and delivered the head  atraumatically.  Nose and mouth were suctioned.  The shoulders were delivered.  Cord was clamped x2 and cut.  Cord blood then was obtained.  Placenta was then removed and the uterus was cleared of all clots and debris.  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  I did not feel like it was heart-shaped on the inside.  The hysterotomy incision was then reapproximated with 0 chromic in a running locked fashion. Second layer of the same was used for imbrication and did a figure-of- eight on the right-hand side for hemostasis.  The gutters were copiously irrigated with saline until clear.  Interceed was then applied to the lower uterine segment over the incision.  The fascia was inspected and hemostasis was achieved and same was done for the rectus muscles.  They were reapproximated along with the peritoneum at the midline in a U stitch fashion.  The fascia was then reapproximated with a 0 Vicryl in a continuous running locked fashion. The subcutaneous base was then reapproximated with 2-0 plain gut in a continuous fashion and the skin was reapproximated with 4-0 Vicryl on a Keith needle.  The patient tolerated the procedure well.  She remained comfortable throughout the procedure.  Time-out was performed prior to the procedure.  All instrument, sponge, and needle counts were correct x3. She received cefotetan 2 g IV prior to the procedure.  SCDs were on and operating.  The infant remained in the room, skin-to-skin, and was doing very well.  Apgars were 9 and 9 and weight is pending.     Pieter Partridge, MD    EBV/MEDQ  D:  04/29/2013  T:  04/29/2013  Job:  [id number]

## 2013-04-30 NOTE — Anesthesia Postprocedure Evaluation (Signed)
  Anesthesia Post-op Note  Patient: Erin Jefferson  Procedure(s) Performed: Procedure(s): Primary cesarean section with delivery of baby boy at 7. Apgars 9/9. (N/A)  Patient Location: Mother/Baby  Anesthesia Type:Spinal  Level of Consciousness: awake, alert , oriented and patient cooperative  Airway and Oxygen Therapy: Patient Spontanous Breathing  Post-op Pain: mild  Post-op Assessment: Patient's Cardiovascular Status Stable, Respiratory Function Stable, No headache, No backache, No residual numbness and No residual motor weakness  Post-op Vital Signs: stable  Complications: No apparent anesthesia complications

## 2013-05-01 NOTE — Progress Notes (Signed)
Subjective: [name] reports tolerating PO, + flatus and no problems voiding.    Objective: Vital signs in last 24 hours: Temp:  [98.2 F (36.8 C)-98.6 F (37 C)] 98.2 F (36.8 C) (08/26 0515) Pulse Rate:  [80-94] 94 (08/26 0515) Resp:  [16-20] 20 (08/26 0515) BP: (99-111)/(67-71) 111/67 mmHg (08/26 0515) SpO2:  [98 %-100 %] 100 % (08/26 0515)  Physical Exam:  General: alert and cooperative Lochia: appropriate Uterine Fundus: firm Incision: healing well DVT Evaluation: No evidence of DVT seen on physical exam.   Recent Labs  04/29/13 1050 04/30/13 0630  HGB 12.9 11.1*  HCT 37.2 32.5*    Assessment/Plan: Status post Cesarean section. Doing well postoperatively.  Continue current care  plan discharge home tomorrow .  Erin Jefferson J. 05/01/2013, 3:26 PM

## 2013-05-01 NOTE — Lactation Note (Signed)
This note was copied from the chart of Erin Jefferson. Lactation Consultation Note: Follow up visit with mom. She had baby latched to breast when I went in. Reports that her nipples are a little tender- that it hurts the first few minutes. Reports no pain at present. Comfort gels given with instructions for use. No questions at present. To call prn  Patient Name: Erin Shekelia Boutin RUEAV'W Date: 05/01/2013 Reason for consult: Follow-up assessment   Maternal Data    Feeding Feeding Type: Breast Milk  LATCH Score/Interventions Latch: Grasps breast easily, tongue down, lips flanged, rhythmical sucking.  Audible Swallowing: A few with stimulation  Type of Nipple: Everted at rest and after stimulation  Comfort (Breast/Nipple): Filling, red/small blisters or bruises, mild/mod discomfort  Problem noted: Mild/Moderate discomfort Interventions (Mild/moderate discomfort): Comfort gels  Hold (Positioning): No assistance needed to correctly position infant at breast.  LATCH Score: 8  Lactation Tools Discussed/Used Tools: Comfort gels   Consult Status Consult Status: PRN    Pamelia Hoit 05/01/2013, 2:43 PM

## 2013-05-02 DIAGNOSIS — B009 Herpesviral infection, unspecified: Secondary | ICD-10-CM | POA: Diagnosis present

## 2013-05-02 LAB — HERPES SIMPLEX VIRUS CULTURE: Special Requests: NORMAL

## 2013-05-02 MED ORDER — IBUPROFEN 600 MG PO TABS: 600.0000 mg | ORAL_TABLET | Freq: Four times a day (QID) | ORAL | Status: DC | PRN

## 2013-05-02 MED ORDER — OXYCODONE-ACETAMINOPHEN 5-325 MG PO TABS
ORAL_TABLET | ORAL | Status: AC
  Administered 2013-05-02: 1 via ORAL
  Filled 2013-05-02: qty 1

## 2013-05-02 MED ORDER — OXYCODONE-ACETAMINOPHEN 5-325 MG PO TABS: 1.0000 | ORAL_TABLET | ORAL | Status: DC | PRN

## 2013-05-02 NOTE — Discharge Summary (Signed)
Obstetric Discharge Summary Reason for Admission: onset of labor Prenatal Procedures: none Intrapartum Procedures: cesarean: low cervical, transverse Postpartum Procedures: none Complications-Operative and Postpartum: none Hemoglobin  Date Value Range Status  04/30/2013 11.1* 12.0 - 15.0 g/dL Final     HCT  Date Value Range Status  04/30/2013 32.5* 36.0 - 46.0 % Final    Physical Exam:  General: alert and cooperative Lochia: appropriate Uterine Fundus: firm Incision: healing well, no significant drainage DVT Evaluation: No evidence of DVT seen on physical exam.  Discharge Diagnoses: Term Pregnancy-delivered  Discharge Information: Date: 05/02/2013 Activity: pelvic rest Diet: routine Medications: PNV, Ibuprofen and Percocet Condition: stable Instructions: refer to practice specific booklet Discharge to: home Follow-up Information   Follow up with Jessee Avers., MD. Schedule an appointment as soon as possible for a visit in 2 weeks. (incision check )    Specialty:  Obstetrics and Gynecology   Contact information:   [address] [place] [address] [phone]       Newborn Data: Live born female  Birth Weight: 7 lb 3 oz (3260 g) APGAR: 9, 9  Home with mother.  Erin Babich J. 05/02/2013, 7:40 AM

## 2013-05-02 NOTE — Lactation Note (Signed)
This note was copied from the chart of Erin Jefferson. Lactation Consultation Note Mom states baby is latching well; states breast feeding is going better than she expected; c/o sore nipples. Mom does have comfort gels, admits that she has not been using them. Enc mom to use the comfort gels. Discussed position and latch with mom, in order to prevent sore nipples and engorgement. Offered to assist, mom accepts. Assisted mom to get baby in position, football on the left, mom states she is much more comfortable when the position is correct. However, baby was too sleepy and did not latch (last meal was 2 hours ago, baby not showing hunger cues yet).  Enc mom to continue STS and feed baby when he wakes, calling for help if needed.  Enc mom to call the lactation office if she has any concerns, and to attend the BFSG.  Patient Name: Erin Marvelyn Bouchillon [id number] Date: 05/02/2013 Reason for consult: Follow-up assessment   Maternal Data    Feeding Feeding Type: Breast Milk Length of feed: 20 min  LATCH Score/Interventions Latch: Too sleepy or reluctant, no latch achieved, no sucking elicited. Intervention(s): Adjust position  Audible Swallowing: Spontaneous and intermittent  Type of Nipple: Everted at rest and after stimulation  Comfort (Breast/Nipple): Filling, red/small blisters or bruises, mild/mod discomfort     Hold (Positioning): No assistance needed to correctly position infant at breast.  LATCH Score: 9  Lactation Tools Discussed/Used     Consult Status Consult Status: Complete    Lenard Forth 05/02/2013, 11:53 AM

## 2014-04-22 ENCOUNTER — Emergency Department (INDEPENDENT_AMBULATORY_CARE_PROVIDER_SITE_OTHER)
Admission: EM | Admit: 2014-04-22 | Discharge: 2014-04-22 | Disposition: A | Discharge status: Home / Self Care | Payer: Self-pay | Source: Home / Self Care

## 2014-04-22 ENCOUNTER — Encounter (HOSPITAL_COMMUNITY): Payer: Self-pay | Admitting: Emergency Medicine

## 2014-04-22 DIAGNOSIS — K5289 Other specified noninfective gastroenteritis and colitis: Secondary | ICD-10-CM

## 2014-04-22 DIAGNOSIS — K529 Noninfective gastroenteritis and colitis, unspecified: Secondary | ICD-10-CM

## 2014-04-22 MED ORDER — ONDANSETRON HCL 4 MG PO TABS: 4.0000 mg | ORAL_TABLET | Freq: Four times a day (QID) | ORAL | Status: DC

## 2014-04-22 NOTE — ED Provider Notes (Signed)
CSN: [id number]     Arrival date & time 04/22/14  1914 History   First MD Initiated Contact with Patient 04/22/14 [phone]     No chief complaint on file.  (Consider location/radiation/quality/duration/timing/severity/associated sxs/prior Treatment) HPI Comments: 23 year old female awoke this morning at 4 AM with epigastric discomfort followed by nausea, vomiting and diarrhea. She denies headache, fever, chest pain or shortness of breath. The discomfort is intermittent and usually associated with the active vomiting. She has not vomited or had diarrhea since she has been in the urgent care.   Past Medical History  Diagnosis Date  . Medical history non-contributory    Past Surgical History  Procedure Laterality Date  . No past surgeries    . Cesarean section N/A 04/29/2013    Procedure: Primary cesarean section with delivery of baby boy at 1459. Apgars 9/9.;  Surgeon: Geryl Rankins, MD;  Location: [facility];  Service: Obstetrics;  Laterality: N/A;   No family history on file. History  Substance Use Topics  . Smoking status: Never Smoker   . Smokeless tobacco: Never Used  . Alcohol Use: No   OB History   Grav Para Term Preterm Abortions TAB SAB Ect Mult Living   Review of Systems  Constitutional: Positive for activity change, appetite change and fatigue. Negative for fever and diaphoresis.  HENT: Negative.   Respiratory: Negative.  Negative for cough and shortness of breath.   Cardiovascular: Negative for chest pain, palpitations and leg swelling.  Gastrointestinal: Positive for abdominal pain. Negative for blood in stool.       As per history of present illness  Genitourinary: Negative.   Skin: Negative.   Neurological: Negative.     Allergies  Review of patient's allergies indicates no known allergies.  Home Medications   Prior to Admission medications   Medication Sig Start Date End Date Taking? Authorizing Provider  ferrous sulfate 325 (65 FE) MG tablet  Take 325 mg by mouth daily with breakfast.    Historical Provider, MD  ibuprofen (ADVIL,MOTRIN) 600 MG tablet Take 1 tablet (600 mg total) by mouth every 6 (six) hours as needed for pain. 05/02/13   Dorien Chihuahua. Richardson Dopp, MD  ondansetron (ZOFRAN) 4 MG tablet Take 1 tablet (4 mg total) by mouth every 6 (six) hours. 04/22/14   Hayden Rasmussen, NP  oxyCODONE-acetaminophen (PERCOCET/ROXICET) 5-325 MG per tablet Take 1-2 tablets by mouth every 4 (four) hours as needed. 05/02/13   Dorien Chihuahua. Richardson Dopp, MD  Prenatal Vit-Fe Fumarate-FA (PRENATAL MULTIVITAMIN) TABS tablet Take 1 tablet by mouth daily at 12 noon.    Historical Provider, MD  valACYclovir (VALTREX) 500 MG tablet Take 500 mg by mouth 2 (two) times daily.    Historical Provider, MD   BP 118/82  Pulse 98  Temp(Src) 97.7 F (36.5 C) (Oral)  Resp 16  SpO2 96% Physical Exam  Nursing note and vitals reviewed. Constitutional: She appears well-developed and well-nourished. No distress.  She is sitting on the end of the exam table. She is in no acute distress. The lites posturing speech is calm and fluent.  HENT:  Head: Normocephalic and atraumatic.  Right Ear: External ear normal.  Left Ear: External ear normal.  Mouth/Throat: Oropharynx is clear and moist. No oropharyngeal exudate.  Eyes: Conjunctivae and EOM are normal. Pupils are equal, round, and reactive to light.  Neck: Normal range of motion. Neck supple.  Cardiovascular: Normal rate, regular rhythm, normal heart  sounds and intact distal pulses.   Pulmonary/Chest: Effort normal and breath sounds normal. No respiratory distress. She has no wheezes. She has no rales.  Abdominal: Soft. Bowel sounds are normal. She exhibits no distension and no mass. There is no rebound and no guarding.  Minor tenderness to deep palpation of the epigastrium.  Musculoskeletal: She exhibits no edema.  Lymphadenopathy:    She has no cervical adenopathy.  Neurological: She is alert. She exhibits normal muscle tone.  Skin: Skin is  warm.  Psychiatric: She has a normal mood and affect.    ED Course  Procedures (including critical care time) Labs Review Labs Reviewed - No data to display  Imaging Review No results found.   MDM   1. Acute gastroenteritis    zofran immodium AD as directed. Do not stop diarhea Maintain hydration, pedialyte and clear diet x 24h.     Hayden Rasmussen, NP 04/22/14 [phone]

## 2014-04-22 NOTE — ED Notes (Signed)
Pt  Reports  Symptoms              Of    nausea  Vomiting  diarrrha   Since  4  Am      Ambulatory  No  Active  Vomiting  At this  Time  -  Pt  Sitting  Upright on the  Exam table  Speaking in  Complete  sentances  In no  Acute  Distress

## 2014-04-22 NOTE — ED Provider Notes (Signed)
Medical screening examination/treatment/procedure(s) were performed by non-physician practitioner and as supervising physician I was immediately available for consultation/collaboration.  Leslee Homeavid Faust Thorington, M.D.  Reuben Likesavid C Mackinze Criado, MD 04/22/14 [phone]

## 2014-04-22 NOTE — Discharge Instructions (Signed)
Viral Gastroenteritis If having more than 3 stools in 6 hours use Immodium AD 1 tablet. Pedialyte and clear liquids for 24 hours. Viral gastroenteritis is also known as stomach flu. This condition affects the stomach and intestinal tract. It can cause sudden diarrhea and vomiting. The illness typically lasts 3 to 8 days. Most people develop an immune response that eventually gets rid of the virus. While this natural response develops, the virus can make you quite ill. CAUSES  Many different viruses can cause gastroenteritis, such as rotavirus or noroviruses. You can catch one of these viruses by consuming contaminated food or water. You may also catch a virus by sharing utensils or other personal items with an infected person or by touching a contaminated surface. SYMPTOMS  The most common symptoms are diarrhea and vomiting. These problems can cause a severe loss of body fluids (dehydration) and a body salt (electrolyte) imbalance. Other symptoms may include:  Fever.  Headache.  Fatigue.  Abdominal pain. DIAGNOSIS  Your caregiver can usually diagnose viral gastroenteritis based on your symptoms and a physical exam. A stool sample may also be taken to test for the presence of viruses or other infections. TREATMENT  This illness typically goes away on its own. Treatments are aimed at rehydration. The most serious cases of viral gastroenteritis involve vomiting so severely that you are not able to keep fluids down. In these cases, fluids must be given through an intravenous line (IV). HOME CARE INSTRUCTIONS   Drink enough fluids to keep your urine clear or pale yellow. Drink small amounts of fluids frequently and increase the amounts as tolerated.  Ask your caregiver for specific rehydration instructions.  Avoid:  Foods high in sugar.  Alcohol.  Carbonated drinks.  Tobacco.  Juice.  Caffeine drinks.  Extremely hot or cold fluids.  Fatty, greasy foods.  Too much intake of  anything at one time.  Dairy products until 24 to 48 hours after diarrhea stops.  You may consume probiotics. Probiotics are active cultures of beneficial bacteria. They may lessen the amount and number of diarrheal stools in adults. Probiotics can be found in yogurt with active cultures and in supplements.  Wash your hands well to avoid spreading the virus.  Only take over-the-counter or prescription medicines for pain, discomfort, or fever as directed by your caregiver. Do not give aspirin to children. Antidiarrheal medicines are not recommended.  Ask your caregiver if you should continue to take your regular prescribed and over-the-counter medicines.  Keep all follow-up appointments as directed by your caregiver. SEEK IMMEDIATE MEDICAL CARE IF:   You are unable to keep fluids down.  You do not urinate at least once every 6 to 8 hours.  You develop shortness of breath.  You notice blood in your stool or vomit. This may look like coffee grounds.  You have abdominal pain that increases or is concentrated in one small area (localized).  You have persistent vomiting or diarrhea.  You have a fever.  The patient is a child younger than 3 months, and he or she has a fever.  The patient is a child older than 3 months, and he or she has a fever and persistent symptoms.  The patient is a child older than 3 months, and he or she has a fever and symptoms suddenly get worse.  The patient is a baby, and he or she has no tears when crying. MAKE SURE YOU:   Understand these instructions.  Will watch your condition.  Will get  help right away if you are not doing well or get worse. Document Released: 08/23/2005 Document Revised: 11/15/2011 Document Reviewed: 06/09/2011 [facility] Patient Information 2015 [place], [place]. This information is not intended to replace advice given to you by your health care provider. Make sure you discuss any questions you have with your health care  provider.

## 2014-05-20 ENCOUNTER — Other Ambulatory Visit (HOSPITAL_COMMUNITY)
Admission: RE | Admit: 2014-05-20 | Discharge: 2014-05-20 | Disposition: A | Discharge status: Home / Self Care | Payer: Medicaid Other | Source: Ambulatory Visit | Attending: Obstetrics and Gynecology | Admitting: Obstetrics and Gynecology

## 2014-05-20 ENCOUNTER — Other Ambulatory Visit: Payer: Self-pay | Admitting: Obstetrics and Gynecology

## 2014-05-20 DIAGNOSIS — Z01419 Encounter for gynecological examination (general) (routine) without abnormal findings: Secondary | ICD-10-CM | POA: Insufficient documentation

## 2014-05-20 DIAGNOSIS — Z113 Encounter for screening for infections with a predominantly sexual mode of transmission: Secondary | ICD-10-CM | POA: Insufficient documentation

## 2014-05-21 LAB — CYTOLOGY - PAP

## 2014-07-08 ENCOUNTER — Encounter (HOSPITAL_COMMUNITY): Payer: Self-pay | Admitting: Emergency Medicine

## 2015-08-20 ENCOUNTER — Emergency Department (INDEPENDENT_AMBULATORY_CARE_PROVIDER_SITE_OTHER)
Admission: EM | Admit: 2015-08-20 | Discharge: 2015-08-20 | Disposition: A | Discharge status: Home / Self Care | Payer: BLUE CROSS/BLUE SHIELD | Source: Home / Self Care | Attending: Family Medicine | Admitting: Family Medicine

## 2015-08-20 ENCOUNTER — Encounter (HOSPITAL_COMMUNITY): Payer: Self-pay | Admitting: *Deleted

## 2015-08-20 DIAGNOSIS — M7551 Bursitis of right shoulder: Secondary | ICD-10-CM

## 2015-08-20 MED ORDER — DICLOFENAC SODIUM 1 % TD GEL
4.0000 g | Freq: Four times a day (QID) | TRANSDERMAL | Status: DC
Start: 2015-08-20 — End: 2016-06-15

## 2015-08-20 NOTE — Discharge Instructions (Signed)
Ice, sling and medicine as prescribed, see orthopedist if further problems

## 2015-08-20 NOTE — ED Provider Notes (Signed)
CSN: [id number]     Arrival date & time 08/20/15  1924 History   First MD Initiated Contact with Patient 08/20/15 2003     Chief Complaint  Patient presents with  . Shoulder Pain   (Consider location/radiation/quality/duration/timing/severity/associated sxs/prior Treatment) Patient is a 24 y.o. female presenting with shoulder pain. The history is provided by the patient.  Shoulder Pain Location:  Shoulder Time since incident:  1 day Injury: no   Shoulder location:  R shoulder Pain details:    Quality:  Burning   Radiates to:  Does not radiate   Severity:  Moderate   Onset quality:  Gradual Chronicity:  Recurrent (h/o tendonitis from playing softball.) Dislocation: no   Relieved by:  None tried Associated symptoms: decreased range of motion and stiffness   Associated symptoms: no back pain   Associated symptoms comment:  Crepitation with rom.   Past Medical History  Diagnosis Date  . Medical history non-contributory    Past Surgical History  Procedure Laterality Date  . No past surgeries    . Cesarean section N/A 04/29/2013    Procedure: Primary cesarean section with delivery of baby boy at 1459. Apgars 9/9.;  Surgeon: Geryl RankinsEvelyn Varnado, MD;  Location: [facility];  Service: Obstetrics;  Laterality: N/A;   History reviewed. No pertinent family history. Social History  Substance Use Topics  . Smoking status: Never Smoker   . Smokeless tobacco: Never Used  . Alcohol Use: No   OB History    Gravida Para Term Preterm AB TAB SAB Ectopic Multiple Living   2 2 2       2      Review of Systems  Constitutional: Negative.   Musculoskeletal: Positive for stiffness. Negative for myalgias, back pain and gait problem.  Skin: Negative.   All other systems reviewed and are negative.   Allergies  Review of patient's allergies indicates no known allergies.  Home Medications   Prior to Admission medications   Medication Sig Start Date End Date Taking? Authorizing Provider   diclofenac sodium (VOLTAREN) 1 % GEL Apply 4 g topically 4 (four) times daily. To shoulder. Please instruct in dosing. 08/20/15   Linna HoffJames D Patches Mcdonnell, MD  ferrous sulfate 325 (65 FE) MG tablet Take 325 mg by mouth daily with breakfast.    Historical Provider, MD  ibuprofen (ADVIL,MOTRIN) 600 MG tablet Take 1 tablet (600 mg total) by mouth every 6 (six) hours as needed for pain. 05/02/13   Gerald Leitzara Cole, MD  ondansetron (ZOFRAN) 4 MG tablet Take 1 tablet (4 mg total) by mouth every 6 (six) hours. 04/22/14   Hayden Rasmussenavid Mabe, NP  oxyCODONE-acetaminophen (PERCOCET/ROXICET) 5-325 MG per tablet Take 1-2 tablets by mouth every 4 (four) hours as needed. 05/02/13   Gerald Leitzara Cole, MD  Prenatal Vit-Fe Fumarate-FA (PRENATAL MULTIVITAMIN) TABS tablet Take 1 tablet by mouth daily at 12 noon.    Historical Provider, MD  valACYclovir (VALTREX) 500 MG tablet Take 500 mg by mouth 2 (two) times daily.    Historical Provider, MD   Meds Ordered and Administered this Visit  Medications - No data to display  BP 107/76 mmHg  Pulse 72  Temp(Src) 98.5 F (36.9 C) (Oral)  Resp 16  SpO2 100%  LMP 07/27/2015 No data found.   Physical Exam  Constitutional: She is oriented to person, place, and time. She appears well-developed and well-nourished.  Neck: Normal range of motion. Neck supple.  Musculoskeletal: She exhibits tenderness.       Right shoulder: She exhibits  decreased range of motion, tenderness, crepitus and pain. She exhibits normal pulse.  Neurological: She is alert and oriented to person, place, and time.  Skin: Skin is warm and dry.  Nursing note and vitals reviewed.   ED Course  Procedures (including critical care time)  Labs Review Labs Reviewed - No data to display  Imaging Review No results found.   Visual Acuity Review  Right Eye Distance:   Left Eye Distance:   Bilateral Distance:    Right Eye Near:   Left Eye Near:    Bilateral Near:         MDM   1. Bursitis of deltoid, right         Linna Hoff, MD 08/20/15 2034

## 2015-08-20 NOTE — ED Notes (Signed)
Pt  Reports    r  Shoulder  aggrevated  By  Stretching      todaAY    tENDER  TO MOVEMENT  AND  PALPATION

## 2016-06-15 ENCOUNTER — Encounter (HOSPITAL_COMMUNITY): Payer: Self-pay | Admitting: *Deleted

## 2016-06-15 ENCOUNTER — Ambulatory Visit (HOSPITAL_COMMUNITY)
Admission: EM | Admit: 2016-06-15 | Discharge: 2016-06-15 | Disposition: A | Discharge status: Home / Self Care | Payer: BLUE CROSS/BLUE SHIELD | Attending: Emergency Medicine | Admitting: Emergency Medicine

## 2016-06-15 DIAGNOSIS — R1012 Left upper quadrant pain: Secondary | ICD-10-CM

## 2016-06-15 DIAGNOSIS — R109 Unspecified abdominal pain: Secondary | ICD-10-CM | POA: Diagnosis not present

## 2016-06-15 DIAGNOSIS — K2900 Acute gastritis without bleeding: Secondary | ICD-10-CM | POA: Diagnosis not present

## 2016-06-15 LAB — POCT URINALYSIS DIP (DEVICE)
BILIRUBIN URINE: NEGATIVE
Glucose, UA: NEGATIVE mg/dL
HGB URINE DIPSTICK: NEGATIVE
Nitrite: NEGATIVE
PH: 6 (ref 5.0–8.0)
PROTEIN: NEGATIVE mg/dL
SPECIFIC GRAVITY, URINE: 1.02 (ref 1.005–1.030)
Urobilinogen, UA: 0.2 mg/dL (ref 0.0–1.0)

## 2016-06-15 LAB — POCT PREGNANCY, URINE: Preg Test, Ur: NEGATIVE

## 2016-06-15 LAB — POCT H PYLORI SCREEN: H. PYLORI SCREEN, POC: NEGATIVE

## 2016-06-15 MED ORDER — PANTOPRAZOLE SODIUM 40 MG PO TBEC: 40.0000 mg | DELAYED_RELEASE_TABLET | Freq: Every day | ORAL | 0 refills | Status: DC

## 2016-06-15 MED ORDER — KETOROLAC TROMETHAMINE 60 MG/2ML IM SOLN
INTRAMUSCULAR | Status: AC
  Filled 2016-06-15: qty 2

## 2016-06-15 MED ORDER — FAMOTIDINE 20 MG PO TABS: 20.0000 mg | ORAL_TABLET | Freq: Two times a day (BID) | ORAL | 0 refills | Status: DC

## 2016-06-15 MED ORDER — GI COCKTAIL ~~LOC~~
30.0000 mL | Freq: Once | ORAL | Status: AC
Start: 2016-06-15 — End: 2016-06-15
  Administered 2016-06-15: 30 mL via ORAL

## 2016-06-15 MED ORDER — GI COCKTAIL ~~LOC~~
ORAL | Status: AC
  Filled 2016-06-15: qty 30

## 2016-06-15 MED ORDER — KETOROLAC TROMETHAMINE 60 MG/2ML IM SOLN
60.0000 mg | Freq: Once | INTRAMUSCULAR | Status: AC
  Administered 2016-06-15: 60 mg via INTRAMUSCULAR

## 2016-06-15 NOTE — ED Triage Notes (Signed)
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

## 2016-06-15 NOTE — Discharge Instructions (Signed)
Your H. pylori test was negative today, however you still may have ulcers. You may take 1 g of Tylenol up to 4 times a day as needed for pain. I would avoid ibuprofen and other NSAIDs at this point in time even that I think that your stomach is irritated.  Follow-up with a primary care physician of your choice, see list below. Due to the ER if you get worse and for the signs and symptoms we discussed.   [facility] [phone] [address] [place] [id number]  [facility]: [address] [place] [id number]  [phone]  [facility]: [address] [place] [id number]   [phone]  [facility]: [address] [place] [address]  [phone]  [facility] : [address] [place] [id number] [phone]  [facility]: [address] [place] [phone]  [facility]: [address] [place] [id number] [phone]  Dr. Oneal GroutMahima Pandey [address] [facility] [place] [id number]  [phone]  Dr. Jackie PlumGeorge Osei-Bonsu, [facility]. [address] [place] [address]  [phone]

## 2016-06-15 NOTE — ED Provider Notes (Signed)
HPI  SUBJECTIVE:  Pt with acute onset of burning periumbilical and left upper quadrant, left flank pain starting this morning. It does not radiate through to her back or migrate. She had similar symptoms like this before, was thought to have gastroenteritis. Sx worse with eating, sitting up straight. No alleviating factors. Sx are not associated with fasting, exertion,  movement, urination, defectation.  Has tried drinking hot water and antacids without relief. No antipyretics in past 4-6 hours. Patient does not take any medications on a regular basis. No sick contacts, change in diet. Some nausea, occasional SOB when the pain gets bad.  No V, fevers, CP, SOB, cough, anorexia, constipation, diarrhea, melena, hematochezia. No abd distension but states feels bloated. No dysuria, urinary urgency, frequency, hematuria. Last BM yesterday, was WNL. No back pain. No syncope.  No vaginal bleeding, abnormal d/c. LMP last week. No possibility of being pregnant. Has nexplanon. No ETOH abuse, pt is not a smoker. No excess NSAID use. No recent trauma to the abdomen. No h/o MI, HTN, DM, CAD, afib, CHF. + h/o c section no other abdominal surgeries. No h/o PUD, biliary disease, cirrhosis, pancreatitis, diverticulitis, diverticulosis, obstruction, GIB. No h/o obstructing nephrolithiasis, AAA. No h/o STD, PID, ovarian cysts. No h/o CA, DVT/PE, hypercoagulability.  No h/o UC, IBS, HIV, recent steroid use. No History of H. pylori. FH neg for nephrolithiasis. PMD: Dr. Altamease Oiler Ridmon  Past Medical History:  Diagnosis Date  . Medical history non-contributory     Past Surgical History:  Procedure Laterality Date  . CESAREAN SECTION N/A 04/29/2013   Procedure: Primary cesarean section with delivery of baby boy at 1459. Apgars 9/9.;  Surgeon: Geryl Rankins, MD;  Location: [facility];  Service: Obstetrics;  Laterality: N/A;  . NO PAST SURGERIES      History reviewed. No pertinent family history.  Social History  Substance Use  Topics  . Smoking status: Never Smoker  . Smokeless tobacco: Never Used  . Alcohol use No    No current facility-administered medications for this encounter.   Current Outpatient Prescriptions:  .  famotidine (PEPCID) 20 MG tablet, Take 1 tablet (20 mg total) by mouth 2 (two) times daily., Disp: 60 tablet, Rfl: 0 .  ferrous sulfate 325 (65 FE) MG tablet, Take 325 mg by mouth daily with breakfast., Disp: , Rfl:  .  pantoprazole (PROTONIX) 40 MG tablet, Take 1 tablet (40 mg total) by mouth daily., Disp: 30 tablet, Rfl: 0 .  Prenatal Vit-Fe Fumarate-FA (PRENATAL MULTIVITAMIN) TABS tablet, Take 1 tablet by mouth daily at 12 noon., Disp: , Rfl:  .  valACYclovir (VALTREX) 500 MG tablet, Take 500 mg by mouth 2 (two) times daily., Disp: , Rfl:   No Known Allergies   ROS  As noted in HPI.   Physical Exam  BP 126/72 (BP Location: Right Arm)   Pulse 72   Temp 98.6 F (37 C) (Oral)   Resp 18   LMP 06/08/2016   SpO2 100%   Constitutional: Well developed, well nourished, no acute distress Eyes: PERRL, EOMI, conjunctiva normal bilaterally HENT: Normocephalic, atraumatic,mucus membranes moist Respiratory: Clear to auscultation bilaterally, no rales, no wheezing, no rhonchi Cardiovascular: Normal rate and rhythm, no murmurs, no gallops, no rubs GI: Normal appearance, left upper quadrant tenderness. Mild suprapubic tenderness, no periumbilical tenderness. neg Murphy. Negative McBurney. No left lower quadrant or right lower quadrant tenderness. Soft, nondistended, normal bowel sounds, no rebound, no guarding. No palpable masses. Back: no CVAT GYN: Deferred skin: No rash, skin  intact Musculoskeletal: No edema, no tenderness, no deformities Neurologic: Alert & oriented x 3, CN II-XII grossly intact, no motor deficits, sensation grossly intact Psychiatric: Speech and behavior appropriate   ED Course   Medications  ketorolac (TORADOL) injection 60 mg (60 mg Intramuscular Given 06/15/16  1407)  gi cocktail (Maalox,Lidocaine,Donnatal) (30 mLs Oral Given 06/15/16 1407)    Orders Placed This Encounter  Procedures  . Urine culture    Standing Status:   Standing    Number of Occurrences:   1    Order Specific Question:   Patient immune status    Answer:   Normal  . POCT urinalysis dip (device)    Standing Status:   Standing    Number of Occurrences:   1  . Pregnancy, urine POC    Standing Status:   Standing    Number of Occurrences:   1  . H.pylori screen, POC    Standing Status:   Standing    Number of Occurrences:   1   Results for orders placed or performed during the hospital encounter of 06/15/16 (from the past 24 hour(s))  POCT urinalysis dip (device)     Status: Abnormal   Collection Time: 06/15/16  1:39 PM  Result Value Ref Range   Glucose, UA NEGATIVE NEGATIVE mg/dL   Bilirubin Urine NEGATIVE NEGATIVE   Ketones, ur TRACE (A) NEGATIVE mg/dL   Specific Gravity, Urine 1.020 1.005 - 1.030   Hgb urine dipstick NEGATIVE NEGATIVE   pH 6.0 5.0 - 8.0   Protein, ur NEGATIVE NEGATIVE mg/dL   Urobilinogen, UA 0.2 0.0 - 1.0 mg/dL   Nitrite NEGATIVE NEGATIVE   Leukocytes, UA SMALL (A) NEGATIVE  Pregnancy, urine POC     Status: None   Collection Time: 06/15/16  1:46 PM  Result Value Ref Range   Preg Test, Ur NEGATIVE NEGATIVE  H.pylori screen, POC     Status: None   Collection Time: 06/15/16  3:25 PM  Result Value Ref Range   H. PYLORI SCREEN, POC NEGATIVE NEGATIVE   No results found.  ED Clinical Impression  Left upper quadrant pain  Acute gastritis, presence of bleeding unspecified, unspecified gastritis type  ED Assessment/Plan   Pt was initially given toradol, and a GI cocktail. Presentation suggestive of peptic ulcer disease or gastritis, however with the suprapubic tenderness will also rule out UTI. Doubt pyelonephritis. Nephrolithiasis also in the differential. Pt abd exam is benign, no peritoneal signs.   Labs reviewed. H. pylori negative. No  evidence of surgical abd. Patient is not pregnant. UA reviewed. She has small leukocytes, trace ketones, negative nitrite. no hematuria. However she has no urinary complaints. Will not treat this as a UTI. We'll send off culture to confirm assess a UTI, and will call in antibiotics if it does come back positive for one.  Doubt SBO, mesenteric ischemia, appendicitis, hepatitis, cholecystitis, pancreatitis, or perforated viscus. No evidence to support or suggest GYN pathology such as ovarian torsion or infection  On re-evaluation, pt feels somewhat improved after the GI cocktail states the Toradol didn't really help.  pt comfortable. Pt's repeat abd exam remains benign. Slightly less left upper quadrant tenderness. Home with Pepcid and Protonix. Discussed labs,  MDM, plan and followup with patient . Discussed sn/sx that should prompt return to the  ED. Patient  agrees with plan.   *This clinic note was created using Dragon dictation software. Therefore, there may be occasional mistakes despite careful proofreading.  ?   Domenick GongAshley Katriona Schmierer, MD  06/15/16 2115  

## 2016-06-17 LAB — URINE CULTURE: SPECIAL REQUESTS: NORMAL

## 2017-01-07 ENCOUNTER — Ambulatory Visit (HOSPITAL_COMMUNITY)
Admission: EM | Admit: 2017-01-07 | Discharge: 2017-01-07 | Disposition: A | Discharge status: Home / Self Care | Payer: BLUE CROSS/BLUE SHIELD | Attending: Family Medicine | Admitting: Family Medicine

## 2017-01-07 ENCOUNTER — Encounter (HOSPITAL_COMMUNITY): Payer: Self-pay

## 2017-01-07 DIAGNOSIS — R110 Nausea: Secondary | ICD-10-CM

## 2017-01-07 DIAGNOSIS — K2900 Acute gastritis without bleeding: Secondary | ICD-10-CM | POA: Diagnosis not present

## 2017-01-07 DIAGNOSIS — Z3202 Encounter for pregnancy test, result negative: Secondary | ICD-10-CM

## 2017-01-07 DIAGNOSIS — R109 Unspecified abdominal pain: Secondary | ICD-10-CM

## 2017-01-07 LAB — POCT URINALYSIS DIP (DEVICE)
Bilirubin Urine: NEGATIVE
GLUCOSE, UA: NEGATIVE mg/dL
Hgb urine dipstick: NEGATIVE
Ketones, ur: NEGATIVE mg/dL
NITRITE: NEGATIVE
PROTEIN: NEGATIVE mg/dL
Specific Gravity, Urine: 1.025 (ref 1.005–1.030)
UROBILINOGEN UA: 0.2 mg/dL (ref 0.0–1.0)
pH: 6 (ref 5.0–8.0)

## 2017-01-07 LAB — POCT PREGNANCY, URINE: Preg Test, Ur: NEGATIVE

## 2017-01-07 MED ORDER — GI COCKTAIL ~~LOC~~
ORAL | Status: AC
  Filled 2017-01-07: qty 30

## 2017-01-07 MED ORDER — OMEPRAZOLE 20 MG PO CPDR: 20.0000 mg | DELAYED_RELEASE_CAPSULE | Freq: Two times a day (BID) | ORAL | 1 refills | Status: DC

## 2017-01-07 MED ORDER — ONDANSETRON 4 MG PO TBDP
4.0000 mg | ORAL_TABLET | Freq: Once | ORAL | Status: AC
  Administered 2017-01-07: 4 mg via ORAL

## 2017-01-07 MED ORDER — ONDANSETRON 4 MG PO TBDP: 4.0000 mg | ORAL_TABLET | Freq: Three times a day (TID) | ORAL | 0 refills | Status: DC | PRN

## 2017-01-07 MED ORDER — ONDANSETRON 4 MG PO TBDP
ORAL_TABLET | ORAL | Status: AC
  Filled 2017-01-07: qty 1

## 2017-01-07 MED ORDER — GI COCKTAIL ~~LOC~~
30.0000 mL | Freq: Once | ORAL | Status: AC
  Administered 2017-01-07: 30 mL via ORAL

## 2017-01-07 NOTE — ED Provider Notes (Signed)
CSN: [id number]     Arrival date & time 01/07/17  1223 History   First MD Initiated Contact with Patient 01/07/17 1243     Chief Complaint  Patient presents with  . Abdominal Pain   (Consider location/radiation/quality/duration/timing/severity/associated sxs/prior Treatment) Patient c/o epigastric abdominal discomfort and nausea.   The history is provided by the patient.  Abdominal Pain  Pain location:  Generalized Pain quality: aching   Pain severity:  Moderate Onset quality:  Sudden Duration:  2 days Timing:  Constant Chronicity:  New Associated symptoms: nausea     Past Medical History:  Diagnosis Date  . Medical history non-contributory    Past Surgical History:  Procedure Laterality Date  . CESAREAN SECTION N/A 04/29/2013   Procedure: Primary cesarean section with delivery of baby boy at 1459. Apgars 9/9.;  Surgeon: Geryl RankinsEvelyn Varnado, MD;  Location: [facility];  Service: Obstetrics;  Laterality: N/A;  . NO PAST SURGERIES     No family history on file. Social History  Substance Use Topics  . Smoking status: Never Smoker  . Smokeless tobacco: Never Used  . Alcohol use No   OB History    Gravida Para Term Preterm AB Living   2 2 2     2    SAB TAB Ectopic Multiple Live Births           1     Review of Systems  Constitutional: Negative.   HENT: Negative.   Eyes: Negative.   Respiratory: Negative.   Cardiovascular: Negative.   Gastrointestinal: Positive for abdominal pain and nausea.  Endocrine: Negative.   Genitourinary: Negative.   Musculoskeletal: Negative.   Allergic/Immunologic: Negative.   Neurological: Negative.   Hematological: Negative.   Psychiatric/Behavioral: Negative.     Allergies  Patient has no known allergies.  Home Medications   Prior to Admission medications   Medication Sig Start Date End Date Taking? Authorizing Provider  etonogestrel (NEXPLANON) 68 MG IMPL implant 1 each by Subdermal route once.   Yes Historical Provider, MD   famotidine (PEPCID) 20 MG tablet Take 1 tablet (20 mg total) by mouth 2 (two) times daily. 06/15/16   Domenick GongAshley Mortenson, MD  ferrous sulfate 325 (65 FE) MG tablet Take 325 mg by mouth daily with breakfast.    Historical Provider, MD  omeprazole (PRILOSEC) 20 MG capsule Take 1 capsule (20 mg total) by mouth 2 (two) times daily before a meal. 01/07/17   Deatra CanterWilliam J Oxford, FNP  ondansetron (ZOFRAN ODT) 4 MG disintegrating tablet Take 1 tablet (4 mg total) by mouth every 8 (eight) hours as needed for nausea or vomiting. 01/07/17   Deatra CanterWilliam J Oxford, FNP  pantoprazole (PROTONIX) 40 MG tablet Take 1 tablet (40 mg total) by mouth daily. 06/15/16   Domenick GongAshley Mortenson, MD  Prenatal Vit-Fe Fumarate-FA (PRENATAL MULTIVITAMIN) TABS tablet Take 1 tablet by mouth daily at 12 noon.    Historical Provider, MD  valACYclovir (VALTREX) 500 MG tablet Take 500 mg by mouth 2 (two) times daily.    Historical Provider, MD   Meds Ordered and Administered this Visit   Medications  gi cocktail (Maalox,Lidocaine,Donnatal) (30 mLs Oral Given 01/07/17 1259)  ondansetron (ZOFRAN-ODT) disintegrating tablet 4 mg (4 mg Oral Given 01/07/17 1308)    BP 109/74 (BP Location: Right Arm)   Pulse 89   Temp 98.7 F (37.1 C) (Oral)   Resp 16   LMP  (LMP Unknown)   SpO2 99%  No data found.   Physical Exam  Constitutional: She appears  well-developed and well-nourished.  HENT:  Head: Normocephalic and atraumatic.  Eyes: Conjunctivae are normal. Pupils are equal, round, and reactive to light.  Neck: Normal range of motion.  Cardiovascular: Normal rate, regular rhythm and normal heart sounds.   Pulmonary/Chest: Effort normal and breath sounds normal.  Abdominal: There is tenderness.  Epigastric abdominal tenderness.  Nursing note and vitals reviewed.   Urgent Care Course     Procedures (including critical care time)  Labs Review Labs Reviewed  POCT URINALYSIS DIP (DEVICE) - Abnormal; Notable for the following:       Result Value    Leukocytes, UA TRACE (*)    All other components within normal limits  POCT PREGNANCY, URINE    Imaging Review No results found.   Visual Acuity Review  Right Eye Distance:   Left Eye Distance:   Bilateral Distance:    Right Eye Near:   Left Eye Near:    Bilateral Near:         MDM   1. Other acute gastritis without hemorrhage    UA preg - Neg UA - trace leukocytes UA cx  GI cocktail Zofran ODT 4mg  now Zofran ODT 4mg  one po tid prn #21 Prilosec 20mg  one po bid #28     Deatra Canter, FNP 01/07/17 1345

## 2017-01-07 NOTE — ED Triage Notes (Signed)
Pt said she has chronic abdominal pain but this time this episode started this morning. She is having abdominal pain, nausea and diarrhea. Convinced it's a stomach ulcer, said it radiates to her back. Generalized pain. No fever or otc medication taken. Doesn't consume a lot of alcohol.

## 2017-01-11 ENCOUNTER — Encounter (HOSPITAL_COMMUNITY): Payer: Self-pay | Admitting: Nurse Practitioner

## 2017-01-11 ENCOUNTER — Emergency Department (HOSPITAL_COMMUNITY)
Admission: EM | Admit: 2017-01-11 | Discharge: 2017-01-11 | Disposition: A | Discharge status: Home / Self Care | Payer: BLUE CROSS/BLUE SHIELD | Attending: Emergency Medicine | Admitting: Emergency Medicine

## 2017-01-11 DIAGNOSIS — R1013 Epigastric pain: Secondary | ICD-10-CM | POA: Diagnosis not present

## 2017-01-11 DIAGNOSIS — Z79899 Other long term (current) drug therapy: Secondary | ICD-10-CM | POA: Insufficient documentation

## 2017-01-11 DIAGNOSIS — R109 Unspecified abdominal pain: Secondary | ICD-10-CM | POA: Diagnosis present

## 2017-01-11 LAB — CBC
HEMATOCRIT: 37.9 % (ref 36.0–46.0)
HEMOGLOBIN: 12.6 g/dL (ref 12.0–15.0)
MCH: 28.4 pg (ref 26.0–34.0)
MCHC: 33.2 g/dL (ref 30.0–36.0)
MCV: 85.4 fL (ref 78.0–100.0)
Platelets: 185 10*3/uL (ref 150–400)
RBC: 4.44 MIL/uL (ref 3.87–5.11)
RDW: 13 % (ref 11.5–15.5)
WBC: 8.5 10*3/uL (ref 4.0–10.5)

## 2017-01-11 LAB — COMPREHENSIVE METABOLIC PANEL
ALT: 20 U/L (ref 14–54)
ANION GAP: 7 (ref 5–15)
AST: 34 U/L (ref 15–41)
Albumin: 4 g/dL (ref 3.5–5.0)
Alkaline Phosphatase: 63 U/L (ref 38–126)
BILIRUBIN TOTAL: 0.5 mg/dL (ref 0.3–1.2)
BUN: 17 mg/dL (ref 6–20)
CO2: 21 mmol/L — ABNORMAL LOW (ref 22–32)
Calcium: 8.7 mg/dL — ABNORMAL LOW (ref 8.9–10.3)
Chloride: 106 mmol/L (ref 101–111)
Creatinine, Ser: 0.7 mg/dL (ref 0.44–1.00)
Glucose, Bld: 95 mg/dL (ref 65–99)
POTASSIUM: 3.8 mmol/L (ref 3.5–5.1)
Sodium: 134 mmol/L — ABNORMAL LOW (ref 135–145)
TOTAL PROTEIN: 8.6 g/dL — AB (ref 6.5–8.1)

## 2017-01-11 LAB — URINALYSIS, ROUTINE W REFLEX MICROSCOPIC
BILIRUBIN URINE: NEGATIVE
Glucose, UA: NEGATIVE mg/dL
KETONES UR: 20 mg/dL — AB
LEUKOCYTES UA: NEGATIVE
NITRITE: NEGATIVE
Protein, ur: NEGATIVE mg/dL
SPECIFIC GRAVITY, URINE: 1.025 (ref 1.005–1.030)
pH: 5 (ref 5.0–8.0)

## 2017-01-11 LAB — I-STAT BETA HCG BLOOD, ED (MC, WL, AP ONLY)

## 2017-01-11 LAB — LIPASE, BLOOD: Lipase: 21 U/L (ref 11–51)

## 2017-01-11 MED ORDER — ONDANSETRON HCL 4 MG/2ML IJ SOLN
4.0000 mg | Freq: Once | INTRAMUSCULAR | Status: AC
  Administered 2017-01-11: 4 mg via INTRAVENOUS
  Filled 2017-01-11: qty 2

## 2017-01-11 MED ORDER — GI COCKTAIL ~~LOC~~
30.0000 mL | Freq: Once | ORAL | Status: AC
  Administered 2017-01-11: 30 mL via ORAL
  Filled 2017-01-11: qty 30

## 2017-01-11 MED ORDER — ACETAMINOPHEN 500 MG PO TABS
1000.0000 mg | ORAL_TABLET | Freq: Once | ORAL | Status: AC
  Administered 2017-01-11: 1000 mg via ORAL
  Filled 2017-01-11: qty 2

## 2017-01-11 MED ORDER — SODIUM CHLORIDE 0.9 % IV BOLUS (SEPSIS)
1000.0000 mL | Freq: Once | INTRAVENOUS | Status: AC
  Administered 2017-01-11: 1000 mL via INTRAVENOUS

## 2017-01-11 MED ORDER — SUCRALFATE 1 G PO TABS: 1.0000 g | ORAL_TABLET | Freq: Three times a day (TID) | ORAL | 0 refills | Status: DC

## 2017-01-11 MED ORDER — SUCRALFATE 1 G PO TABS
1.0000 g | ORAL_TABLET | Freq: Once | ORAL | Status: AC
  Administered 2017-01-11: 1 g via ORAL
  Filled 2017-01-11: qty 1

## 2017-01-11 MED ORDER — FAMOTIDINE IN NACL 20-0.9 MG/50ML-% IV SOLN
20.0000 mg | Freq: Once | INTRAVENOUS | Status: AC
  Administered 2017-01-11: 20 mg via INTRAVENOUS
  Filled 2017-01-11: qty 50

## 2017-01-11 MED ORDER — DICYCLOMINE HCL 20 MG PO TABS: 20.0000 mg | ORAL_TABLET | Freq: Two times a day (BID) | ORAL | 0 refills | Status: DC

## 2017-01-11 NOTE — Discharge Instructions (Signed)
Take your medications as prescribed. I recommend continuing to take your prescription of Prilosec and Zofran as prescribed. Continue drinking fluids at home trimming hydrated. I recommend refrain from taking any NSAIDs such as ibuprofen, Aleve or Advil. I recommend calling the gastroenterology clinic listed below to schedule follow-up appointment with next week if your symptoms are not improved. Please return to the Emergency Department if symptoms worsen or new onset of fever, chest pain, difficulty breathing, new/worsening abdominal pain, vomiting and unable to keep fluids down, blood in emesis.

## 2017-01-11 NOTE — ED Notes (Signed)
Pt aware that urine sample is needed, but is unable to provide one at this time 

## 2017-01-11 NOTE — ED Triage Notes (Signed)
Pt presents with c/o GI problem. Her symptoms began on Friday. She reports chills, nausea, vomiting, abdominal pain. She denies urinary changes, vaginal discharge or bleeding, constipation, diarrhea.  [facility], discharged with prilosec and zofran which shes tried with minimal improvement

## 2017-01-11 NOTE — ED Provider Notes (Signed)
[facility]-EMERGENCY DEPT Provider Note   CSN: [id number] Arrival date & time: 01/11/17  1503     History   Chief Complaint Chief Complaint  Patient presents with  . GI Problem    HPI Erin Jefferson is a 26 y.o. female.  HPI   Patient is a 26 yo female with no pertinent past medical history presents the ED with complaint of abdominal pain, onset 5 days. Patient reports having constant aching pain to her upper abdomen with intermittent cramping. Denies any aggravating or alleviating factors. Endorses associated nausea and NBNB vomiting and states she has been unable to keep any meds down today. She notes she was initially seen at an urgent care 5 days ago and was given Prilosec and Bentyl for suspected peptic ulcer, which she states she has been taking without relief of symptoms. Denies fever, chills, cough, shortness of breath, chest pain, hematemesis, diarrhea, constipation, urinary symptoms, vaginal bleeding or discharge. Patient reports history of similar episodes of abdominal pain in the past which she relates to when she was taking Aleve frequently for her tendinitis. She denies any recent use of NSAIDs. Endorses abdominal surgical history of C-section. Denies any known sick contacts.  Past Medical History:  Diagnosis Date  . Medical history non-contributory     Patient Active Problem List   Diagnosis Date Noted  . 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 CESAREAN SECTION N/A 04/29/2013   Procedure: Primary cesarean section with delivery of baby boy at 1459. Apgars 9/9.;  Surgeon: Geryl Rankins, MD;  Location: [facility];  Service: Obstetrics;  Laterality: N/A;  . NO PAST SURGERIES      OB History    Gravida Para Term Preterm AB Living   2 2 2     2    SAB TAB Ectopic Multiple Live Births           1       Home Medications    Prior to Admission medications   Medication Sig Start Date End Date Taking?  Authorizing Provider  calcium carbonate (TUMS - DOSED IN MG ELEMENTAL CALCIUM) 500 MG chewable tablet Chew 1 tablet by mouth 2 (two) times daily with a meal. Acid reflux   Yes [provider]  citalopram (CELEXA) 10 MG tablet Take 10 mg by mouth daily.   Yes [provider]  dicyclomine (BENTYL) 20 MG tablet Take 20 mg by mouth every 6 (six) hours.   Yes [provider]  omeprazole (PRILOSEC) 20 MG capsule Take 1 capsule (20 mg total) by mouth 2 (two) times daily before a meal. 01/07/17  Yes Oxford, Anselm Pancoast, FNP  ondansetron (ZOFRAN ODT) 4 MG disintegrating tablet Take 1 tablet (4 mg total) by mouth every 8 (eight) hours as needed for nausea or vomiting. 01/07/17  Yes Deatra Canter, FNP  dicyclomine (BENTYL) 20 MG tablet Take 1 tablet (20 mg total) by mouth 2 (two) times daily. 01/11/17   Barrett Henle, PA-C  etonogestrel (NEXPLANON) 68 MG IMPL implant 1 each by Subdermal route once.    [provider]  famotidine (PEPCID) 20 MG tablet Take 1 tablet (20 mg total) by mouth 2 (two) times daily. Patient not taking: Reported on 01/11/2017 06/15/16   Domenick Gong, MD  ferrous sulfate 325 (65 FE) MG tablet Take 325 mg by mouth daily with breakfast.    [provider]  pantoprazole (PROTONIX) 40 MG tablet Take 1 tablet (  40 mg total) by mouth daily. Patient not taking: Reported on 01/11/2017 06/15/16   Domenick Gong, MD  Prenatal Vit-Fe Fumarate-FA (PRENATAL MULTIVITAMIN) TABS tablet Take 1 tablet by mouth daily at 12 noon.    [provider]  sucralfate (CARAFATE) 1 g tablet Take 1 tablet (1 g total) by mouth 4 (four) times daily -  with meals and at bedtime. 01/11/17   Barrett Henle, PA-C  valACYclovir (VALTREX) 500 MG tablet Take 500 mg by mouth 2 (two) times daily.    [provider]    Family History History reviewed. No pertinent family history.  Social History Social History  Substance Use Topics  . Smoking  status: Never Smoker  . Smokeless tobacco: Never Used  . Alcohol use No     Allergies   Patient has no known allergies.   Review of Systems Review of Systems  Gastrointestinal: Positive for abdominal pain, nausea and vomiting.  All other systems reviewed and are negative.    Physical Exam Updated Vital Signs BP 105/78 (BP Location: Right Arm)   Pulse 90   Temp 99.1 F (37.3 C) (Oral)   Resp 16   LMP  (LMP Unknown)   SpO2 99%   Physical Exam  Constitutional: She is oriented to person, place, and time. She appears well-developed and well-nourished. No distress.  HENT:  Head: Normocephalic and atraumatic.  Mouth/Throat: Oropharynx is clear and moist. No oropharyngeal exudate.  Eyes: Conjunctivae and EOM are normal. Right eye exhibits no discharge. Left eye exhibits no discharge. No scleral icterus.  Neck: Normal range of motion. Neck supple.  Cardiovascular: Normal rate, regular rhythm, normal heart sounds and intact distal pulses.   Pulmonary/Chest: Effort normal and breath sounds normal. No respiratory distress. She has no wheezes. She has no rales. She exhibits no tenderness.  Abdominal: Soft. Normal appearance and bowel sounds are normal. She exhibits no distension and no mass. There is tenderness in the epigastric area and periumbilical area. There is no rigidity, no rebound, no guarding and no CVA tenderness. No hernia.  Musculoskeletal: Normal range of motion. She exhibits no edema.  Neurological: She is alert and oriented to person, place, and time.  Skin: Skin is warm and dry. She is not diaphoretic.  Nursing note and vitals reviewed.    ED Treatments / Results  Labs (all labs ordered are listed, but only abnormal results are displayed) Labs Reviewed  COMPREHENSIVE METABOLIC PANEL - Abnormal; Notable for the following:       Result Value   Sodium 134 (*)    CO2 21 (*)    Calcium 8.7 (*)    Total Protein 8.6 (*)    All other components within normal limits    URINALYSIS, ROUTINE W REFLEX MICROSCOPIC - Abnormal; Notable for the following:    APPearance HAZY (*)    Hgb urine dipstick MODERATE (*)    Ketones, ur 20 (*)    Bacteria, UA RARE (*)    Squamous Epithelial / LPF 0-5 (*)    All other components within normal limits  LIPASE, BLOOD  CBC  I-STAT BETA HCG BLOOD, ED ([facility], [facility], [facility] ONLY)    EKG  EKG Interpretation None       Radiology No results found.  Procedures Procedures (including critical care time)  Medications Ordered in ED Medications  sodium chloride 0.9 % bolus 1,000 mL (0 mLs Intravenous Stopped 01/11/17 1947)  ondansetron (ZOFRAN) injection 4 mg (4 mg Intravenous Given 01/11/17 1823)  famotidine (PEPCID)  IVPB 20 mg premix (0 mg Intravenous Stopped 01/11/17 1947)  gi cocktail (Maalox,Lidocaine,Donnatal) (30 mLs Oral Given 01/11/17 1823)  sucralfate (CARAFATE) tablet 1 g (1 g Oral Given 01/11/17 1947)  ondansetron (ZOFRAN) injection 4 mg (4 mg Intravenous Given 01/11/17 1952)  acetaminophen (TYLENOL) tablet 1,000 mg (1,000 mg Oral Given 01/11/17 1952)     Initial Impression / Assessment and Plan / ED Course  I have reviewed the triage vital signs and the nursing notes.  Pertinent labs & imaging results that were available during my care of the patient were reviewed by me and considered in my medical decision making (see chart for details).    Patient presents with upper abdominal pain for the past 5 days with associated nausea and vomiting. Denies any aggravating or relieving factors. Reports history of similar abdominal pain in the past when she was diagnosed with stomach ulcers. Denies recent use of NSAIDs. Denies hematemesis, vaginal bleeding or discharge. VSS. Exam revealed tenderness over epigastric and periumbilical region, no peritoneal signs. Remaining exam unremarkable. Patient given IV fluids, Pepcid, Zofran and GI cocktail. On reevaluation patient reports improvement of symptoms. Pregnancy negative. Labs unremarkable. Pt  tolerating PO. Suspect pt's sxs are likely due to GERD vs PUD. On repeat exam patient does not have a surgical abdomen and there are no peritoneal signs. No indication of appendicitis, bowel obstruction, bowel perforation, cholecystitis, diverticulitis, PID or ectopic pregnancy. Plan to d/c pt home with symptomatic tx including carafate and bentyl; advised to continue taking rx of prilosec and zofran as prescribed. Pt given info to follow up with GI outpatient for further evaluation. Discussed return precautions.    UA pending. Hand-off to Earley Favorgail Schulz, NP.    Final Clinical Impressions(s) / ED Diagnoses   Final diagnoses:  Epigastric pain    New Prescriptions Discharge Medication List as of 01/11/2017  8:37 PM    START taking these medications   Details  !! dicyclomine (BENTYL) 20 MG tablet Take 1 tablet (20 mg total) by mouth 2 (two) times daily., Starting Tue 01/11/2017, Print    sucralfate (CARAFATE) 1 g tablet Take 1 tablet (1 g total) by mouth 4 (four) times daily -  with meals and at bedtime., Starting Tue 01/11/2017, Print     !! - Potential duplicate medications found. Please discuss with provider.       Barrett Henleadeau, Nicole Elizabeth, PA-C 01/12/17 2243    Geoffery Lyonselo, [place], MD 01/15/17 [phone]

## 2017-01-12 ENCOUNTER — Other Ambulatory Visit: Payer: Self-pay | Admitting: Gastroenterology

## 2017-01-12 DIAGNOSIS — R1013 Epigastric pain: Secondary | ICD-10-CM

## 2017-01-12 NOTE — ED Provider Notes (Signed)
Report received at shift change I was asked to check urine results.  If normal, patient is to be discharged as previously planned. Urine is negative.  Patient informed discharged   Earley FavorSchulz, Kennadi Albany, NP 01/12/17 0230    Geoffery Lyonselo, [place], MD 01/15/17 [phone]

## 2017-01-24 ENCOUNTER — Ambulatory Visit
Admission: RE | Admit: 2017-01-24 | Discharge: 2017-01-24 | Disposition: A | Discharge status: Home / Self Care | Payer: BLUE CROSS/BLUE SHIELD | Source: Ambulatory Visit | Attending: Gastroenterology | Admitting: Gastroenterology

## 2017-01-24 DIAGNOSIS — R1013 Epigastric pain: Secondary | ICD-10-CM

## 2017-06-27 ENCOUNTER — Encounter (HOSPITAL_COMMUNITY): Payer: Self-pay | Admitting: Emergency Medicine

## 2017-06-27 DIAGNOSIS — N390 Urinary tract infection, site not specified: Secondary | ICD-10-CM | POA: Insufficient documentation

## 2017-06-27 DIAGNOSIS — R1032 Left lower quadrant pain: Secondary | ICD-10-CM | POA: Diagnosis present

## 2017-06-27 DIAGNOSIS — N200 Calculus of kidney: Secondary | ICD-10-CM | POA: Diagnosis not present

## 2017-06-27 DIAGNOSIS — Z79899 Other long term (current) drug therapy: Secondary | ICD-10-CM | POA: Insufficient documentation

## 2017-06-27 LAB — CBC WITH DIFFERENTIAL/PLATELET
BASOS ABS: 0 10*3/uL (ref 0.0–0.1)
Basophils Relative: 0 %
EOS PCT: 0 %
Eosinophils Absolute: 0 10*3/uL (ref 0.0–0.7)
HCT: 36.3 % (ref 36.0–46.0)
Hemoglobin: 11.8 g/dL — ABNORMAL LOW (ref 12.0–15.0)
LYMPHS ABS: 2.3 10*3/uL (ref 0.7–4.0)
LYMPHS PCT: 27 %
MCH: 28 pg (ref 26.0–34.0)
MCHC: 32.5 g/dL (ref 30.0–36.0)
MCV: 86.2 fL (ref 78.0–100.0)
MONO ABS: 0.6 10*3/uL (ref 0.1–1.0)
MONOS PCT: 7 %
Neutro Abs: 5.6 10*3/uL (ref 1.7–7.7)
Neutrophils Relative %: 66 %
PLATELETS: 206 10*3/uL (ref 150–400)
RBC: 4.21 MIL/uL (ref 3.87–5.11)
RDW: 13.5 % (ref 11.5–15.5)
WBC: 8.5 10*3/uL (ref 4.0–10.5)

## 2017-06-27 LAB — I-STAT TROPONIN, ED: Troponin i, poc: 0 ng/mL (ref 0.00–0.08)

## 2017-06-27 LAB — URINALYSIS, ROUTINE W REFLEX MICROSCOPIC
BILIRUBIN URINE: NEGATIVE
GLUCOSE, UA: NEGATIVE mg/dL
KETONES UR: NEGATIVE mg/dL
NITRITE: NEGATIVE
PH: 7 (ref 5.0–8.0)
Protein, ur: 100 mg/dL — AB
SPECIFIC GRAVITY, URINE: 1.02 (ref 1.005–1.030)

## 2017-06-27 LAB — COMPREHENSIVE METABOLIC PANEL
ALT: 18 U/L (ref 14–54)
AST: 34 U/L (ref 15–41)
Albumin: 3.8 g/dL (ref 3.5–5.0)
Alkaline Phosphatase: 70 U/L (ref 38–126)
Anion gap: 6 (ref 5–15)
BUN: 15 mg/dL (ref 6–20)
CO2: 26 mmol/L (ref 22–32)
Calcium: 8.9 mg/dL (ref 8.9–10.3)
Chloride: 104 mmol/L (ref 101–111)
Creatinine, Ser: 0.92 mg/dL (ref 0.44–1.00)
GFR calc Af Amer: >60 mL/min (ref >60)
GFR calc non Af Amer: >60 mL/min (ref >60)
Glucose, Bld: 90 mg/dL (ref 65–99)
Potassium: 3.8 mmol/L (ref 3.5–5.1)
Sodium: 136 mmol/L (ref 135–145)
Total Bilirubin: 0.5 mg/dL (ref 0.3–1.2)
Total Protein: 7.6 g/dL (ref 6.5–8.1)

## 2017-06-27 MED ORDER — OXYCODONE-ACETAMINOPHEN 5-325 MG PO TABS
1.0000 | ORAL_TABLET | Freq: Once | ORAL | Status: AC
  Administered 2017-06-27: 1 via ORAL

## 2017-06-27 MED ORDER — OXYCODONE-ACETAMINOPHEN 5-325 MG PO TABS
ORAL_TABLET | ORAL | Status: AC
  Filled 2017-06-27: qty 1

## 2017-06-27 NOTE — ED Triage Notes (Signed)
Patient reports pesristent left flank pain onset this evening , denies injury , no hematuria or dysuria .

## 2017-06-28 ENCOUNTER — Emergency Department (HOSPITAL_COMMUNITY): Payer: BLUE CROSS/BLUE SHIELD

## 2017-06-28 ENCOUNTER — Emergency Department (HOSPITAL_COMMUNITY)
Admission: EM | Admit: 2017-06-28 | Discharge: 2017-06-28 | Disposition: A | Discharge status: Home / Self Care | Payer: BLUE CROSS/BLUE SHIELD | Attending: Emergency Medicine | Admitting: Emergency Medicine

## 2017-06-28 DIAGNOSIS — N390 Urinary tract infection, site not specified: Secondary | ICD-10-CM

## 2017-06-28 DIAGNOSIS — N200 Calculus of kidney: Secondary | ICD-10-CM

## 2017-06-28 LAB — I-STAT BETA HCG BLOOD, ED (MC, WL, AP ONLY)

## 2017-06-28 MED ORDER — OXYCODONE-ACETAMINOPHEN 5-325 MG PO TABS: 1.0000 | ORAL_TABLET | Freq: Four times a day (QID) | ORAL | 0 refills | Status: AC | PRN

## 2017-06-28 MED ORDER — CEFTRIAXONE SODIUM 1 G IJ SOLR
1.0000 g | Freq: Once | INTRAMUSCULAR | Status: AC
  Administered 2017-06-28: 1 g via INTRAMUSCULAR
  Filled 2017-06-28: qty 10

## 2017-06-28 MED ORDER — KETOROLAC TROMETHAMINE 30 MG/ML IJ SOLN
30.0000 mg | Freq: Once | INTRAMUSCULAR | Status: AC
  Administered 2017-06-28: 30 mg via INTRAMUSCULAR
  Filled 2017-06-28: qty 1

## 2017-06-28 MED ORDER — CIPROFLOXACIN HCL 500 MG PO TABS: 500.0000 mg | ORAL_TABLET | Freq: Two times a day (BID) | ORAL | 0 refills | Status: DC

## 2017-06-28 MED ORDER — OXYCODONE-ACETAMINOPHEN 5-325 MG PO TABS
1.0000 | ORAL_TABLET | Freq: Once | ORAL | Status: AC
  Administered 2017-06-28: 1 via ORAL
  Filled 2017-06-28: qty 1

## 2017-06-28 NOTE — ED Notes (Signed)
E-signature not available, pt verbalized understanding of DC instructions  

## 2017-06-28 NOTE — Discharge Instructions (Signed)
You were seen today with kidney stones. Continue pain medication at home. Make sure that you are staying hydrated. You may also have an infection. Take antibiotics as directed. If you have new or worsening pain, nausea, fevers or any new or worsening symptoms she needs to be reevaluated. This kidney stone will likely pass on its own; however, you were provided with urology follow-up.

## 2017-06-28 NOTE — ED Provider Notes (Signed)
[facility] EMERGENCY DEPARTMENT Provider Note   CSN: [id number] Arrival date & time: 06/27/17  2310     History   Chief Complaint Chief Complaint  Patient presents with  . Flank Pain    HPI Erin Jefferson is a 26 y.o. female.  HPI  This is a 26 year old female with no significant past medical history presents with left flank pain. Patient reports 2 day history of waxing and waning left flank pain. It starts in her left back and radiates into her left lower abdomen.  Current pain is 8 out of 10. She states that Percocet did help some in the waiting room. Denies fevers, dysuria, hematuria. She denies nausea, vomiting, diarrhea. She has never had pain like this before.  Past Medical History:  Diagnosis Date  . Medical history non-contributory     Patient Active Problem List   Diagnosis Date Noted  . 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 CESAREAN SECTION N/A 04/29/2013   Procedure: Primary cesarean section with delivery of baby boy at 1459. Apgars 9/9.;  Surgeon: Geryl Rankins, MD;  Location: [facility];  Service: Obstetrics;  Laterality: N/A;  . NO PAST SURGERIES      OB History    Gravida Para Term Preterm AB Living   2 2 2     2    SAB TAB Ectopic Multiple Live Births           1       Home Medications    Prior to Admission medications   Medication Sig Start Date End Date Taking? Authorizing Provider  calcium carbonate (TUMS - DOSED IN MG ELEMENTAL CALCIUM) 500 MG chewable tablet Chew 1 tablet by mouth 2 (two) times daily with a meal. Acid reflux    [provider]  ciprofloxacin (CIPRO) 500 MG tablet Take 1 tablet (500 mg total) by mouth 2 (two) times daily. 06/28/17   Horton, Mayer Masker, MD  citalopram (CELEXA) 10 MG tablet Take 10 mg by mouth daily.    [provider]  dicyclomine (BENTYL) 20 MG tablet Take 20 mg by mouth every 6 (six) hours.    [provider]  dicyclomine (BENTYL) 20 MG tablet Take 1 tablet (20 mg total) by mouth 2 (two) times daily. 01/11/17   Barrett Henle, PA-C  etonogestrel (NEXPLANON) 68 MG IMPL implant 1 each by Subdermal route once.    [provider]  famotidine (PEPCID) 20 MG tablet Take 1 tablet (20 mg total) by mouth 2 (two) times daily. Patient not taking: Reported on 01/11/2017 06/15/16   Domenick Gong, MD  ferrous sulfate 325 (65 FE) MG tablet Take 325 mg by mouth daily with breakfast.    [provider]  omeprazole (PRILOSEC) 20 MG capsule Take 1 capsule (20 mg total) by mouth 2 (two) times daily before a meal. 01/07/17   Oxford, Anselm Pancoast, FNP  ondansetron (ZOFRAN ODT) 4 MG disintegrating tablet Take 1 tablet (4 mg total) by mouth every 8 (eight) hours as needed for nausea or vomiting. 01/07/17   Deatra Canter, FNP  oxyCODONE-acetaminophen (PERCOCET/ROXICET) 5-325 MG tablet Take 1 tablet by mouth every 6 (six) hours as needed for severe pain. 06/28/17   Horton, Mayer Masker, MD  pantoprazole (PROTONIX) 40 MG tablet Take 1 tablet (40 mg total) by mouth daily. Patient not taking: Reported on 01/11/2017 06/15/16   Domenick Gong, MD  Prenatal Vit-Fe Fumarate-FA (PRENATAL  MULTIVITAMIN) TABS tablet Take 1 tablet by mouth daily at 12 noon.    [provider]  sucralfate (CARAFATE) 1 g tablet Take 1 tablet (1 g total) by mouth 4 (four) times daily -  with meals and at bedtime. 01/11/17   Barrett Henle, PA-C  valACYclovir (VALTREX) 500 MG tablet Take 500 mg by mouth 2 (two) times daily.    [provider]    Family History No family history on file.  Social History Social History  Substance Use Topics  . Smoking status: Never Smoker  . Smokeless tobacco: Never Used  . Alcohol use No     Allergies   Patient has no known allergies.   Review of Systems Review of Systems  Constitutional: Negative for fever.  Respiratory: Negative for shortness of  breath.   Cardiovascular: Negative for chest pain.  Gastrointestinal: Negative for abdominal pain and nausea.  Genitourinary: Positive for flank pain. Negative for dysuria and hematuria.  All other systems reviewed and are negative.    Physical Exam Updated Vital Signs BP 97/65 (BP Location: Right Arm)   Pulse 88   Temp 97.6 F (36.4 C) (Oral)   Resp 16   Ht 5\' 3"  (1.6 m)   Wt 65.8 kg (145 lb)   SpO2 100%   BMI 25.69 kg/m   Physical Exam  Constitutional: She is oriented to person, place, and time. She appears well-developed and well-nourished.  HENT:  Head: Normocephalic and atraumatic.  Cardiovascular: Normal rate, regular rhythm and normal heart sounds.   Pulmonary/Chest: Effort normal and breath sounds normal. No respiratory distress. She has no wheezes.  Abdominal: Soft. She exhibits no distension.  Genitourinary:  Genitourinary Comments: No CVA tenderness  Neurological: She is alert and oriented to person, place, and time.  Skin: Skin is warm and dry.  Psychiatric: She has a normal mood and affect.  Nursing note and vitals reviewed.    ED Treatments / Results  Labs (all labs ordered are listed, but only abnormal results are displayed) Labs Reviewed  CBC WITH DIFFERENTIAL/PLATELET - Abnormal; Notable for the following:       Result Value   Hemoglobin 11.8 (*)    All other components within normal limits  URINALYSIS, ROUTINE W REFLEX MICROSCOPIC - Abnormal; Notable for the following:    APPearance HAZY (*)    Hgb urine dipstick MODERATE (*)    Protein, ur 100 (*)    Leukocytes, UA MODERATE (*)    Bacteria, UA RARE (*)    Squamous Epithelial / LPF 6-30 (*)    All other components within normal limits  URINE CULTURE  COMPREHENSIVE METABOLIC PANEL  I-STAT BETA HCG BLOOD, ED ([facility], [facility], [facility] ONLY)  I-STAT TROPONIN, ED    EKG  EKG Interpretation None       Radiology Ct Renal Stone Study  Result Date: 06/28/2017 CLINICAL DATA:  Acute onset of flank pain.   Initial encounter. EXAM: CT ABDOMEN AND PELVIS WITHOUT CONTRAST TECHNIQUE: Multidetector CT imaging of the abdomen and pelvis was performed following the standard protocol without IV contrast. COMPARISON:  None. FINDINGS: Lower chest: The visualized lung bases are grossly clear. The visualized portions of the mediastinum are unremarkable. Hepatobiliary: The liver is unremarkable in appearance. The gallbladder is unremarkable in appearance. The common bile duct remains normal in caliber. Pancreas: The pancreas is within normal limits. Spleen: The spleen is unremarkable in appearance. Adrenals/Urinary Tract: The adrenal glands are unremarkable in appearance. Scattered small nonobstructing bilateral renal stones measure  up to 3 mm in size. There is question of mild left renal pelvicaliectasis. A 3 mm stone is suspected at the distal left ureter, just above the left vesicoureteral junction. No perinephric stranding is seen. Stomach/Bowel: The stomach is unremarkable in appearance. The small bowel is within normal limits. The appendix is normal in caliber, without evidence of appendicitis. The colon is unremarkable in appearance. Vascular/Lymphatic: The abdominal aorta is unremarkable in appearance. The inferior vena cava is grossly unremarkable. No retroperitoneal lymphadenopathy is seen. No pelvic sidewall lymphadenopathy is identified. Reproductive: The bladder is mildly distended and grossly unremarkable. The uterus is grossly unremarkable in appearance. The ovaries are relatively symmetric, though not well assessed without contrast. No suspicious adnexal masses are seen. Other: No additional soft tissue abnormalities are seen. Musculoskeletal: No acute osseous abnormalities are identified. The visualized musculature is unremarkable in appearance. IMPRESSION: 1. Suspect 3 mm obstructing stone at the distal left ureter, just above the left vesicoureteral junction. Question of mild left renal pelvicaliectasis, without  significant hydronephrosis. 2. Scattered small nonobstructing bilateral renal stones measure up to 3 mm in size. Electronically Signed   By: Roanna RaiderJeffery  Chang M.D.   On: 06/28/2017 04:04    Procedures Procedures (including critical care time)  Medications Ordered in ED Medications  oxyCODONE-acetaminophen (PERCOCET/ROXICET) 5-325 MG per tablet 1 tablet (1 tablet Oral Given 06/27/17 2323)  oxyCODONE-acetaminophen (PERCOCET/ROXICET) 5-325 MG per tablet 1 tablet (1 tablet Oral Given 06/28/17 0330)  ketorolac (TORADOL) 30 MG/ML injection 30 mg (30 mg Intramuscular Given 06/28/17 0330)  cefTRIAXone (ROCEPHIN) injection 1 g (1 g Intramuscular Given 06/28/17 0415)     Initial Impression / Assessment and Plan / ED Course  I have reviewed the triage vital signs and the nursing notes.  Pertinent labs & imaging results that were available during my care of the patient were reviewed by me and considered in my medical decision making (see chart for details).     She presents with left flank pain. History and physical exam are most suggestive of possible kidney stone. Urinalysis also has too numerous to count white cells; however, patient denies fevers or dysuria. No history of kidney stones. Patient was given pain and nausea medication. She is comfortable at this time. CT scan shows a possible 3 mm stone. She has no signs or symptoms of sepsis and her workup is otherwise reassuring. Urine culture sent. She was given IM Rocephin. Will treat supportively with urology follow-up and antibiotics for possible infected stone. Patient was given strict return precautions.  After history, exam, and medical workup I feel the patient has been appropriately medically screened and is safe for discharge home. Pertinent diagnoses were discussed with the patient. Patient was given return precautions.   Final Clinical Impressions(s) / ED Diagnoses   Final diagnoses:  Kidney stone  Complicated UTI (urinary tract  infection)    New Prescriptions New Prescriptions   CIPROFLOXACIN (CIPRO) 500 MG TABLET    Take 1 tablet (500 mg total) by mouth 2 (two) times daily.   OXYCODONE-ACETAMINOPHEN (PERCOCET/ROXICET) 5-325 MG TABLET    Take 1 tablet by mouth every 6 (six) hours as needed for severe pain.     Shon BatonHorton, Courtney F, MD 06/28/17 [phone]

## 2017-06-30 LAB — URINE CULTURE

## 2017-07-01 ENCOUNTER — Telehealth: Payer: Self-pay

## 2017-07-01 NOTE — Telephone Encounter (Signed)
Post ED Visit - Positive Culture Follow-up  Culture report reviewed by antimicrobial stewardship pharmacist:  []  Erin Jefferson, Pharm.D. []  Erin Jefferson, Pharm.D., BCPS AQ-ID [x]  Erin Jefferson, Pharm.D., BCPS []  Erin Jefferson, Pharm.D., BCPS []  Erin Jefferson, [address]Pharm.D., BCPS, AAHIVP []  Erin Jefferson, Pharm.D., BCPS, AAHIVP []  Erin Jefferson, PharmD, BCPS []  Erin Jefferson, PharmD, BCPS []  Erin Jefferson, PharmD, BCPS  Positive urine culture Treated with Cipro, organism sensitive to the same and no further patient follow-up is required at this time.  Erin Jefferson, Erin Jefferson 07/01/2017, 10:06 AM

## 2017-07-20 ENCOUNTER — Other Ambulatory Visit: Payer: Self-pay | Admitting: Urology

## 2017-07-22 ENCOUNTER — Other Ambulatory Visit: Payer: Self-pay

## 2017-07-22 ENCOUNTER — Encounter (HOSPITAL_BASED_OUTPATIENT_CLINIC_OR_DEPARTMENT_OTHER): Payer: Self-pay | Admitting: *Deleted

## 2017-07-22 NOTE — Progress Notes (Signed)
NPO AFTER MN.  ARRIVE AT 0800.  CURRENT LAB RESULTS IN CHART AND Epic.  WILL TAKE FLOMAX AM DOS W/ SIPS OF WATER AND IF NEEDED TAKE PERCOCET.

## 2017-07-26 ENCOUNTER — Encounter (HOSPITAL_COMMUNITY): Payer: Self-pay | Admitting: *Deleted

## 2017-07-26 ENCOUNTER — Other Ambulatory Visit: Payer: Self-pay

## 2017-07-27 ENCOUNTER — Encounter (HOSPITAL_COMMUNITY): Payer: Self-pay | Admitting: *Deleted

## 2017-07-27 ENCOUNTER — Other Ambulatory Visit: Payer: Self-pay

## 2017-07-27 ENCOUNTER — Inpatient Hospital Stay (HOSPITAL_COMMUNITY): Payer: BLUE CROSS/BLUE SHIELD | Admitting: Anesthesiology

## 2017-07-27 ENCOUNTER — Ambulatory Visit (HOSPITAL_COMMUNITY): Payer: BLUE CROSS/BLUE SHIELD

## 2017-07-27 ENCOUNTER — Encounter (HOSPITAL_COMMUNITY)
Admission: RE | Disposition: A | Discharge status: Home / Self Care | Payer: Self-pay | Source: Ambulatory Visit | Attending: Urology

## 2017-07-27 ENCOUNTER — Ambulatory Visit (HOSPITAL_BASED_OUTPATIENT_CLINIC_OR_DEPARTMENT_OTHER)
Admission: RE | Admit: 2017-07-27 | Discharge: 2017-07-27 | Disposition: A | Discharge status: Home / Self Care | Payer: BLUE CROSS/BLUE SHIELD | Source: Ambulatory Visit | Attending: Urology | Admitting: Urology

## 2017-07-27 DIAGNOSIS — Z87891 Personal history of nicotine dependence: Secondary | ICD-10-CM | POA: Insufficient documentation

## 2017-07-27 DIAGNOSIS — N201 Calculus of ureter: Secondary | ICD-10-CM | POA: Insufficient documentation

## 2017-07-27 DIAGNOSIS — Z79899 Other long term (current) drug therapy: Secondary | ICD-10-CM | POA: Diagnosis not present

## 2017-07-27 HISTORY — DX: Calculus of kidney: N20.0

## 2017-07-27 HISTORY — PX: CYSTOSCOPY WITH RETROGRADE PYELOGRAM, URETEROSCOPY AND STENT PLACEMENT: SHX5789

## 2017-07-27 HISTORY — DX: Gastro-esophageal reflux disease without esophagitis: K21.9

## 2017-07-27 HISTORY — DX: Frequency of micturition: R35.0

## 2017-07-27 HISTORY — DX: Presence of spectacles and contact lenses: Z97.3

## 2017-07-27 HISTORY — DX: Personal history of urinary calculi: Z87.442

## 2017-07-27 HISTORY — DX: Calculus of ureter: N20.1

## 2017-07-27 LAB — PREGNANCY, URINE: PREG TEST UR: NEGATIVE

## 2017-07-27 SURGERY — CYSTOURETEROSCOPY, WITH RETROGRADE PYELOGRAM AND STENT INSERTION
Anesthesia: General | Site: Bladder

## 2017-07-27 MED ORDER — ONDANSETRON HCL 4 MG/2ML IJ SOLN
INTRAMUSCULAR | Status: AC
  Filled 2017-07-27: qty 2

## 2017-07-27 MED ORDER — EPHEDRINE SULFATE-NACL 50-0.9 MG/10ML-% IV SOSY
PREFILLED_SYRINGE | INTRAVENOUS | Status: DC | PRN
  Administered 2017-07-27: 10 mg via INTRAVENOUS

## 2017-07-27 MED ORDER — PROPOFOL 10 MG/ML IV BOLUS
INTRAVENOUS | Status: DC | PRN
  Administered 2017-07-27: 200 mg via INTRAVENOUS

## 2017-07-27 MED ORDER — PROMETHAZINE HCL 25 MG/ML IJ SOLN: 6.2500 mg | INTRAMUSCULAR | Status: DC | PRN

## 2017-07-27 MED ORDER — CEFAZOLIN SODIUM-DEXTROSE 2-4 GM/100ML-% IV SOLN
INTRAVENOUS | Status: AC
  Filled 2017-07-27: qty 100

## 2017-07-27 MED ORDER — KETOROLAC TROMETHAMINE 30 MG/ML IJ SOLN: 30.0000 mg | Freq: Once | INTRAMUSCULAR | Status: DC | PRN

## 2017-07-27 MED ORDER — FENTANYL CITRATE (PF) 100 MCG/2ML IJ SOLN: 25.0000 ug | INTRAMUSCULAR | Status: DC | PRN

## 2017-07-27 MED ORDER — DEXAMETHASONE SODIUM PHOSPHATE 10 MG/ML IJ SOLN
INTRAMUSCULAR | Status: AC
  Filled 2017-07-27: qty 1

## 2017-07-27 MED ORDER — OXYCODONE HCL 5 MG PO TABS
5.0000 mg | ORAL_TABLET | Freq: Once | ORAL | Status: AC
  Administered 2017-07-27: 5 mg via ORAL

## 2017-07-27 MED ORDER — SODIUM CHLORIDE 0.9 % IR SOLN
Status: DC | PRN
  Administered 2017-07-27 (×3): 1000 mL via INTRAVESICAL
  Administered 2017-07-27: 3000 mL via INTRAVESICAL

## 2017-07-27 MED ORDER — CEFAZOLIN SODIUM-DEXTROSE 2-4 GM/100ML-% IV SOLN
2.0000 g | Freq: Once | INTRAVENOUS | Status: AC
  Administered 2017-07-27: 2 g via INTRAVENOUS

## 2017-07-27 MED ORDER — LIDOCAINE 2% (20 MG/ML) 5 ML SYRINGE
INTRAMUSCULAR | Status: AC
  Filled 2017-07-27: qty 5

## 2017-07-27 MED ORDER — IOHEXOL 300 MG/ML  SOLN
INTRAMUSCULAR | Status: DC | PRN
  Administered 2017-07-27: 20 mL via INTRAVENOUS

## 2017-07-27 MED ORDER — DEXAMETHASONE SODIUM PHOSPHATE 4 MG/ML IJ SOLN
INTRAMUSCULAR | Status: DC | PRN
  Administered 2017-07-27: 10 mg via INTRAVENOUS

## 2017-07-27 MED ORDER — KETOROLAC TROMETHAMINE 30 MG/ML IJ SOLN
INTRAMUSCULAR | Status: DC | PRN
  Administered 2017-07-27: 30 mg via INTRAVENOUS

## 2017-07-27 MED ORDER — PROPOFOL 10 MG/ML IV BOLUS
INTRAVENOUS | Status: AC
  Filled 2017-07-27: qty 40

## 2017-07-27 MED ORDER — KETOROLAC TROMETHAMINE 30 MG/ML IJ SOLN
30.0000 mg | Freq: Once | INTRAMUSCULAR | Status: DC | PRN
  Administered 2017-07-27: 30 mg via INTRAVENOUS
  Filled 2017-07-27: qty 1

## 2017-07-27 MED ORDER — OXYCODONE HCL 5 MG PO TABS
ORAL_TABLET | ORAL | Status: DC
Start: 2017-07-27 — End: 2017-07-27
  Filled 2017-07-27: qty 1

## 2017-07-27 MED ORDER — LIDOCAINE HCL 2 % EX GEL
CUTANEOUS | Status: AC
  Filled 2017-07-27: qty 5

## 2017-07-27 MED ORDER — LACTATED RINGERS IV SOLN
INTRAVENOUS | Status: DC
  Administered 2017-07-27 (×2): via INTRAVENOUS

## 2017-07-27 MED ORDER — OXYCODONE-ACETAMINOPHEN 5-325 MG PO TABS: 1.0000 | ORAL_TABLET | ORAL | Status: DC | PRN

## 2017-07-27 MED ORDER — LIDOCAINE 2% (20 MG/ML) 5 ML SYRINGE
INTRAMUSCULAR | Status: DC | PRN
  Administered 2017-07-27: 80 mg via INTRAVENOUS

## 2017-07-27 MED ORDER — FENTANYL CITRATE (PF) 100 MCG/2ML IJ SOLN
INTRAMUSCULAR | Status: AC
  Filled 2017-07-27: qty 2

## 2017-07-27 MED ORDER — EPHEDRINE 5 MG/ML INJ
INTRAVENOUS | Status: AC
  Filled 2017-07-27: qty 10

## 2017-07-27 MED ORDER — ONDANSETRON HCL 4 MG/2ML IJ SOLN
INTRAMUSCULAR | Status: DC | PRN
  Administered 2017-07-27: 4 mg via INTRAVENOUS

## 2017-07-27 MED ORDER — ARTIFICIAL TEARS OPHTHALMIC OINT
TOPICAL_OINTMENT | OPHTHALMIC | Status: AC
  Filled 2017-07-27: qty 3.5

## 2017-07-27 MED ORDER — MIDAZOLAM HCL 5 MG/5ML IJ SOLN
INTRAMUSCULAR | Status: DC | PRN
  Administered 2017-07-27: 2 mg via INTRAVENOUS

## 2017-07-27 MED ORDER — BELLADONNA-OPIUM 16.2-30 MG RE SUPP
RECTAL | Status: AC
  Filled 2017-07-27: qty 1

## 2017-07-27 MED ORDER — KETOROLAC TROMETHAMINE 30 MG/ML IJ SOLN
INTRAMUSCULAR | Status: AC
  Filled 2017-07-27: qty 1

## 2017-07-27 MED ORDER — LACTATED RINGERS IV SOLN: INTRAVENOUS | Status: DC

## 2017-07-27 MED ORDER — MIDAZOLAM HCL 2 MG/2ML IJ SOLN
INTRAMUSCULAR | Status: AC
Start: 2017-07-27 — End: 2017-07-27
  Filled 2017-07-27: qty 2

## 2017-07-27 MED ORDER — FENTANYL CITRATE (PF) 100 MCG/2ML IJ SOLN
INTRAMUSCULAR | Status: DC | PRN
  Administered 2017-07-27 (×3): 50 ug via INTRAVENOUS

## 2017-07-27 SURGICAL SUPPLY — 21 items
BAG DRAIN URO-CYSTO SKYTR STRL (DRAIN) ×4 IMPLANT
BASKET ZERO TIP NITINOL 2.4FR (BASKET) ×4 IMPLANT
CATH INTERMIT  6FR 70CM (CATHETERS) IMPLANT
CLOTH BEACON ORANGE TIMEOUT ST (SAFETY) ×4 IMPLANT
FIBER LASER FLEXIVA 365 (UROLOGICAL SUPPLIES) IMPLANT
FIBER LASER TRAC TIP (UROLOGICAL SUPPLIES) IMPLANT
GLOVE BIO SURGEON STRL SZ7.5 (GLOVE) ×4 IMPLANT
GOWN STRL REUS W/TWL XL LVL3 (GOWN DISPOSABLE) IMPLANT
GUIDEWIRE STR DUAL SENSOR (WIRE) ×12 IMPLANT
INFUSOR MANOMETER BAG 3000ML (MISCELLANEOUS) ×4 IMPLANT
IV NS 1000ML (IV SOLUTION) ×2
IV NS 1000ML BAXH (IV SOLUTION) ×2 IMPLANT
IV NS IRRIG 3000ML ARTHROMATIC (IV SOLUTION) ×4 IMPLANT
KIT RM TURNOVER CYSTO AR (KITS) ×4 IMPLANT
MANIFOLD NEPTUNE II (INSTRUMENTS) ×4 IMPLANT
NS IRRIG 500ML POUR BTL (IV SOLUTION) ×4 IMPLANT
PACK CYSTO (CUSTOM PROCEDURE TRAY) ×4 IMPLANT
SHEATH URETERAL 12FRX35CM (MISCELLANEOUS) ×4 IMPLANT
STENT URET 6FRX24 CONTOUR (STENTS) ×4 IMPLANT
TUBE CONNECTING 12'X1/4 (SUCTIONS) ×1
TUBE CONNECTING 12X1/4 (SUCTIONS) ×3 IMPLANT

## 2017-07-27 NOTE — Transfer of Care (Signed)
  Last Vitals:  Vitals:   07/27/17 0732  BP: 120/80  Pulse: 71  Resp: 16  Temp: 36.7 C  SpO2: 100%    Last Pain:  Vitals:   07/27/17 0732  TempSrc: Oral      Patients Stated Pain Goal: 3 (07/27/17 0746) Immediate Anesthesia Transfer of Care Note  Patient: Erin Jefferson  Procedure(s) Performed: Procedure(s) (LRB): CYSTOSCOPY WITH LEFT RETROGRADE PYELOGRAM, LEFT URETEROSCOPY, HOLMIUM LASER LITHOTRIPSY AND STENT PLACEMENT (Left) HOLMIUM LASER APPLICATION (N/A)  Patient Location: PACU  Anesthesia Type: General  Level of Consciousness: sleepy  Airway & Oxygen Therapy: Patient Spontanous Breathing and Patient connected to face mask oxygen  Post-op Assessment: Report given to PACU RN and Post -op Vital signs reviewed and stable  Post vital signs: Reviewed and stable  Complications: No apparent anesthesia complications

## 2017-07-27 NOTE — Anesthesia Postprocedure Evaluation (Signed)
Anesthesia Post Note  Patient: Erin Jefferson  Procedure(s) Performed: CYSTOSCOPY WITH LEFT RETROGRADE PYELOGRAM, LEFT URETEROSCOPY, HOLMIUM LASER LITHOTRIPSY AND STENT PLACEMENT (Left Bladder) HOLMIUM LASER APPLICATION (N/A Bladder)     Patient location during evaluation: PACU Anesthesia Type: General Level of consciousness: awake and alert Pain management: pain level controlled Vital Signs Assessment: post-procedure vital signs reviewed and stable Respiratory status: spontaneous breathing, nonlabored ventilation, respiratory function stable and patient connected to nasal cannula oxygen Cardiovascular status: blood pressure returned to baseline and stable Postop Assessment: no apparent nausea or vomiting Anesthetic complications: no    Last Vitals:  Vitals:   07/27/17 1200 07/27/17 1215  BP: 119/75 108/63  Pulse: 80 69  Resp: 18 16  Temp:  36.4 C  SpO2: 100% 100%    Last Pain:  Vitals:   07/27/17 1222  TempSrc:   PainSc: 6                  Rodrickus Min S

## 2017-07-27 NOTE — Anesthesia Procedure Notes (Signed)
Procedure Name: LMA Insertion Date/Time: 07/27/2017 10:12 AM Performed by: Eilene Ghaziose, George, MD Pre-anesthesia Checklist: Patient identified, Emergency Drugs available, Suction available and Patient being monitored Patient Re-evaluated:Patient Re-evaluated prior to induction Oxygen Delivery Method: Circle system utilized Preoxygenation: Pre-oxygenation with 100% oxygen Induction Type: IV induction Ventilation: Mask ventilation without difficulty LMA: LMA inserted LMA Size: 4.0 Number of attempts: 1 Airway Equipment and Method: Bite block Placement Confirmation: positive ETCO2 Tube secured with: Tape Dental Injury: Teeth and Oropharynx as per pre-operative assessment

## 2017-07-27 NOTE — Op Note (Signed)
Operative Note  Preoperative diagnosis:  1.  Left ureteral calculus  Postoperative diagnosis: 1.  Left ureteral calculus  Procedure(s): 1.  Cystoscopy with left retrograde pyelogram, left diagnostic ureteroscopy, ureteral stent placement  Surgeon: Modena SlaterEugene Preslei Blakley, MD  Assistants: None  Anesthesia: General  Complications: None immediate  EBL: Minimal  Specimens: 1.  None  Drains/Catheters: 1.  6 x 24 double-J ureteral stent  Intraoperative findings: 1.  Normal urethra and bladder 2.  Left retrograde pyelogram revealed no filling defects.  The kidney was well opacified. 3.  No renal or ureteral calculi seen  Indication: 26 year old female who presented with acute left-sided flank pain and was found to have a 3-4 mm left distal ureteral calculus.  Her pain has not resolved.  She presents for the after mentioned operation.  Description of procedure:  The patient was identified and consent was obtained.  She was taken operating room and placed in the supine position.  She was placed under general anesthesia.  She was converted to dorsal lithotomy.  She was prepped and draped in standard sterile fashion.  A timeout was performed.  Perioperative antibiotics were administered.  A 23 French rigid cystoscope was advanced into the urethra and into the bladder and a complete cystoscopy was performed with no abnormal findings.  I advanced a sensor wire up the left ureter and into the kidney under fluoroscopic guidance.  I then advanced a semirigid ureteroscope alongside the wire and performed a complete ureteroscopy up to the level of the renal pelvis.  I then withdrew the scope and before completely withdrawn get a shot a retrograde pyelogram through the scope with the findings noted above.  I then advanced a second sensor wire through the scope.  I then advanced a 12 x 14 ureteral access sheath over the wire under continuous fluoroscopic guidance of the ureter.  I then performed a flexible  ureteroscopy and a complete pyeloscopy and did not identify any stone fragments.  I therefore withdrew the scope along with the access sheath visualizing the entire ureter upon removal.  There is mild trauma to the ureter secondary to the access sheath but no significant injury and again there were no ureteral calculi seen.  I then advanced a 6 x 24 double-J ureteral stent over the wire under fluoroscopic guidance and remove the wire.  Fluoroscopy confirmed proximal placement and I readvanced the scope into the bladder and confirmed distal coil in the bladder.  A string remained in place.  I drained the bladder and withdrew the scope and this concluded the operation.  The patient tolerated procedure well and was stable postoperatively.  Plan: The patient may remove her stent on Monday morning.  Follow-up in 4-6 weeks for a renal ultrasound and KUB.

## 2017-07-27 NOTE — Discharge Instructions (Addendum)
[facility] [phone] Post Ureteroscopy With or Without Stent Instructions  Remove your stent on Monday morning by gently pulling the string.  The stent is about 1 foot long. Stand in shower or tub to do this. Gently pull on string. You will feel a sensation in your left flank which will radiate to your urethra when removing stent.  Definitions:  Ureter: The duct that transports urine from the kidney to the bladder. Stent:   A plastic hollow tube that is placed into the ureter, from the kidney to the                 bladder to prevent the ureter from swelling shut.  GENERAL INSTRUCTIONS:  Despite the fact that no skin incisions were used, the area around the ureter and bladder is raw and irritated. The stent is a foreign body which will further irritate the bladder wall. This irritation is manifested by increased frequency of urination, both day and night, and by an increase in the urge to urinate. In some, the urge to urinate is present almost always. Sometimes the urge is strong enough that you may not be able to stop yourself from urinating. The only real cure is to remove the stent and then give time for the bladder wall to heal which can't be done until the danger of the ureter swelling shut has passed, which varies.  You may see some blood in your urine while the stent is in place and a few days afterwards. Do not be alarmed, even if the urine was clear for a while. Get off your feet and drink lots of fluids until clearing occurs. If you start to pass clots or don't improve, call us.  DIET: You may return to your normal diet immediately. Because of the raw surface of your bladder, alcohol, spicy foods, acid type foods and drinks with caffeine may cause irritation or frequency and should be used in moderation. To keep your urine flowing freely and to avoid constipation, drink plenty of fluids during the day ( 8-10 glasses ). Tip: Avoid cranberry juice because it is very  acidic.  ACTIVITY: Your physical activity doesn't need to be restricted. However, if you are very active, you may see some blood in your urine. We suggest that you reduce your activity under these circumstances until the bleeding has stopped.  BOWELS: It is important to keep your bowels regular during the postoperative period. Straining with bowel movements can cause bleeding. A bowel movement every other day is reasonable. Use a mild laxative if needed, such as Milk of Magnesia 2-3 tablespoons, or 2 Dulcolax tablets. Call if you continue to have problems. If you have been taking narcotics for pain, before, during or after your surgery, you may be constipated. Take a laxative if necessary.   MEDICATION: You should resume your pre-surgery medications unless told not to. You may take oxybutynin or flomax if prescribed for bladder spasms or discomfort from the stent Take pain medication as directed for pain refractory to conservative management  PROBLEMS YOU SHOULD REPORT TO US:  Fevers over 100.5 Fahrenheit.  Heavy bleeding, or clots ( See above notes about blood in urine ).  Inability to urinate.  Drug reactions ( hives, rash, nausea, vomiting, diarrhea ). Severe burning or pain with urination that is not improving.   General Anesthesia, Adult, Care After These instructions provide you with information about caring for yourself after your procedure. Your health care provider may also give you more specific  instructions. Your treatment has been planned according to current medical practices, but problems sometimes occur. Call your health care provider if you have any problems or questions after your procedure. What can I expect after the procedure? After the procedure, it is common to have:  Vomiting.  A sore throat.  Mental slowness.  It is common to feel:  Nauseous.  Cold or shivery.  Sleepy.  Tired.  Sore or achy, even in parts of your body where you did not have  surgery.  Follow these instructions at home: For at least 24 hours after the procedure:  Do not: ? Participate in activities where you could fall or become injured. ? Drive. ? Use heavy machinery. ? Drink alcohol. ? Take sleeping pills or medicines that cause drowsiness. ? Make important decisions or sign legal documents. ? Take care of children on your own.  Rest. Eating and drinking  If you vomit, drink water, juice, or soup when you can drink without vomiting.  Drink enough fluid to keep your urine clear or pale yellow.  Make sure you have little or no nausea before eating solid foods.  Follow the diet recommended by your health care provider. General instructions  Have a responsible adult stay with you until you are awake and alert.  Return to your normal activities as told by your health care provider. Ask your health care provider what activities are safe for you.  Take over-the-counter and prescription medicines only as told by your health care provider.  If you smoke, do not smoke without supervision.  Keep all follow-up visits as told by your health care provider. This is important. Contact a health care provider if:  You continue to have nausea or vomiting at home, and medicines are not helpful.  You cannot drink fluids or start eating again.  You cannot urinate after 8-12 hours.  You develop a skin rash.  You have fever.  You have increasing redness at the site of your procedure. Get help right away if:  You have difficulty breathing.  You have chest pain.  You have unexpected bleeding.  You feel that you are having a life-threatening or urgent problem. This information is not intended to replace advice given to you by your health care provider. Make sure you discuss any questions you have with your health care provider. Document Released: 11/29/2000 Document Revised: 01/26/2016 Document Reviewed: 08/07/2015 Elsevier Interactive Patient Education   [employer]2018 [employer]

## 2017-07-27 NOTE — H&P (Signed)
CC: I have kidney stones.  HPI: Erin Jefferson is a 26 year-old female patient who is here for renal calculi.  The problem is on the left side. She first stated noticing pain on 06/28/2017. This is her first kidney stone. She is currently having flank pain and back pain. She denies having groin pain, nausea, vomiting, fever, and chills. She has not caught a stone in her urine strainer since her symptoms began.   She has never had surgical treatment for calculi in the past.   Went to the emergency Department for flank pain on 06/28/2017. She was found to have a 3-4 mm left distal ureteral calculus. She continues to have left flank pain. It is tolerable. No nausea vomiting fevers chills.     ALLERGIES: None   MEDICATIONS: Cipro 500 mg tablet  Oxycodone-Acetaminophen 5 mg-325 mg tablet     GU PSH: None   NON-GU PSH: C-Section - about 2014    GU PMH: None   NON-GU PMH: Anxiety GERD    FAMILY HISTORY: 1 Daughter - Other 1 son - Other   SOCIAL HISTORY: Marital Status: Single Preferred Language: English; Race: Black or African American Current Smoking Status: Patient does not smoke anymore.   Tobacco Use Assessment Completed: Used Tobacco in last 30 days? Does not drink caffeine.    REVIEW OF SYSTEMS:    GU Review Female:   Patient reports frequent urination and get up at night to urinate. Patient denies hard to postpone urination, burning /pain with urination, leakage of urine, stream starts and stops, trouble starting your stream, have to strain to urinate, and being pregnant.  Gastrointestinal (Upper):   Patient reports nausea and indigestion/ heartburn. Patient denies vomiting.  Gastrointestinal (Lower):   Patient denies diarrhea and constipation.  Constitutional:   Patient denies fever, night sweats, weight loss, and fatigue.  Skin:   Patient denies skin rash/ lesion and itching.  Eyes:   Patient denies blurred vision and double vision.  Ears/ Nose/ Throat:   Patient  denies sore throat and sinus problems.  Hematologic/Lymphatic:   Patient denies swollen glands and easy bruising.  Cardiovascular:   Patient denies leg swelling and chest pains.  Respiratory:   Patient denies cough and shortness of breath.  Endocrine:   Patient denies excessive thirst.  Musculoskeletal:   Patient reports back pain. Patient denies joint pain.  Neurological:   Patient reports headaches and dizziness.   Psychologic:   Patient denies depression and anxiety.   VITAL SIGNS:      07/07/2017 11:36 AM  Weight 150 lb / 68.04 kg  Height 63 in / 160.02 cm  BP 111/79 mmHg  Heart Rate 78 /min  Temperature 98.0 F / 36.6 C  BMI 26.6 kg/m   MULTI-SYSTEM PHYSICAL EXAMINATION:    Constitutional: Well-nourished. No physical deformities. Normally developed. Good grooming.  Respiratory: No labored breathing, no use of accessory muscles.   Cardiovascular: Normal temperature, adequate peripheral perfusion  Skin: No paleness, no jaundice,  Neurologic / Psychiatric: Oriented to time, oriented to place, oriented to person. No depression, no anxiety, no agitation.  Gastrointestinal: No mass, no tenderness, no rigidity, non obese abdomen. No CVA tenderness bilaterally  Eyes: Normal conjunctivae. Normal eyelids.  Musculoskeletal: Normal gait and station of head and neck.     PAST DATA REVIEWED:  Source Of History:  Patient  X-Ray Review: C.T. Abdomen/Pelvis: Reviewed Films. Reviewed Report. Discussed With Patient.     PROCEDURES:  Urinalysis w/Scope Dipstick Dipstick Cont'd Micro  Color: Yellow Bilirubin: Neg WBC/hpf: 0 - 5/hpf  Appearance: Clear Ketones: Neg RBC/hpf: 3 - 10/hpf  Specific Gravity: 1.025 Blood: 1+ Bacteria: Rare (0-9/hpf)  pH: 6.0 Protein: Neg Cystals: NS (Not Seen)  Glucose: Neg Urobilinogen: 0.2 Casts: NS (Not Seen)    Nitrites: Neg Trichomonas: Not Present    Leukocyte Esterase: Neg Mucous: Present      Epithelial Cells: 0 - 5/hpf      Yeast: NS (Not Seen)       Sperm: Not Present    ASSESSMENT:      ICD-10 Details  1 GU:   Ureteral calculus - N20.1    PLAN:            Medications New Meds: Tamsulosin Hcl 0.4 mg capsule, ext release 24 hr 1 capsule PO Daily   #14  3 Refill(s)  Oxycodone-Acetaminophen 5 mg-325 mg tablet 1 tablet PO Q 6 H PRN For pain  #15  0 Refill(s)            Orders Labs Urine Culture  Lab Notes: Stone risk          Document Letter(s):  Created for Patient: Clinical Summary         Notes:   We discussed the management of urinary stones. These options include observation, ureteroscopy, and shockwave lithotripsy. We discussed which options are relevant to these particular stones. We discussed the natural history of stones as well as the complications of untreated stones and the impact on quality of life without treatment as well as with each of the above listed treatments. We also discussed the efficacy of each treatment in its ability to clear the stone burden. With any of these management options I discussed the signs and symptoms of infection and the need for emergent treatment should these be experienced. For each option we discussed the ability of each procedure to clear the patient of their stone burden.   For observation I described the risks which include but are not limited to silent renal damage, life-threatening infection, need for emergent surgery, failure to pass stone, and pain.   For ureteroscopy I described the risks which include heart attack, stroke, pulmonary embolus, death, bleeding, infection, damage to contiguous structures, positioning injury, ureteral stricture, ureteral avulsion, ureteral injury, need for ureteral stent, inability to perform ureteroscopy, need for an interval procedure, inability to clear stone burden, stent discomfort and pain.   For shockwave lithotripsy I described the risks which include arrhythmia, kidney contusion, kidney hemorrhage, need for transfusion, pain, inability to  break up stone, inability to pass stone fragments, Steinstrasse, infection associated with obstructing stones, need for different surgical procedure, need for repeat shockwave lithotripsy, and death.   She would like to proceed with ureteroscopy.   She will continue to strain her urine, start tamsulosin, and I provided her with another prescription for oxycodone.

## 2017-07-27 NOTE — Anesthesia Preprocedure Evaluation (Signed)
Anesthesia Evaluation  Patient identified by MRN, date of birth, ID band Patient awake    Reviewed: Allergy & Precautions, NPO status , Patient's Chart, lab work & pertinent test results  Airway Mallampati: II  TM Distance: >3 FB Neck ROM: Full    Dental no notable dental hx.    Pulmonary neg pulmonary ROS, former smoker,    Pulmonary exam normal breath sounds clear to auscultation       Cardiovascular negative cardio ROS Normal cardiovascular exam Rhythm:Regular Rate:Normal     Neuro/Psych negative neurological ROS  negative psych ROS   GI/Hepatic negative GI ROS, Neg liver ROS,   Endo/Other  negative endocrine ROS  Renal/GU negative Renal ROS  negative genitourinary   Musculoskeletal negative musculoskeletal ROS (+)   Abdominal   Peds negative pediatric ROS (+)  Hematology negative hematology ROS (+)   Anesthesia Other Findings   Reproductive/Obstetrics negative OB ROS                             Anesthesia Physical Anesthesia Plan  ASA: I  Anesthesia Plan: General   Post-op Pain Management:    Induction: Intravenous  PONV Risk Score and Plan: 3 and Ondansetron, Dexamethasone, Treatment may vary due to age or medical condition and Midazolam  Airway Management Planned: LMA  Additional Equipment:   Intra-op Plan:   Post-operative Plan:   Informed Consent: I have reviewed the patients History and Physical, chart, labs and discussed the procedure including the risks, benefits and alternatives for the proposed anesthesia with the patient or authorized representative who has indicated his/her understanding and acceptance.   Dental advisory given  Plan Discussed with: CRNA and Surgeon  Anesthesia Plan Comments:         Anesthesia Quick Evaluation

## 2017-07-30 ENCOUNTER — Encounter (HOSPITAL_COMMUNITY): Payer: Self-pay | Admitting: Emergency Medicine

## 2017-07-30 ENCOUNTER — Emergency Department (HOSPITAL_COMMUNITY)
Admission: EM | Admit: 2017-07-30 | Discharge: 2017-07-30 | Disposition: A | Discharge status: Home / Self Care | Payer: BLUE CROSS/BLUE SHIELD | Attending: Emergency Medicine | Admitting: Emergency Medicine

## 2017-07-30 ENCOUNTER — Other Ambulatory Visit: Payer: Self-pay

## 2017-07-30 DIAGNOSIS — Z87891 Personal history of nicotine dependence: Secondary | ICD-10-CM | POA: Insufficient documentation

## 2017-07-30 DIAGNOSIS — N390 Urinary tract infection, site not specified: Secondary | ICD-10-CM

## 2017-07-30 DIAGNOSIS — R319 Hematuria, unspecified: Secondary | ICD-10-CM

## 2017-07-30 DIAGNOSIS — R1084 Generalized abdominal pain: Secondary | ICD-10-CM | POA: Diagnosis present

## 2017-07-30 DIAGNOSIS — Z960 Presence of urogenital implants: Secondary | ICD-10-CM | POA: Diagnosis not present

## 2017-07-30 LAB — URINALYSIS, ROUTINE W REFLEX MICROSCOPIC
BILIRUBIN URINE: NEGATIVE
GLUCOSE, UA: NEGATIVE mg/dL
Ketones, ur: NEGATIVE mg/dL
NITRITE: NEGATIVE
PH: 6 (ref 5.0–8.0)
Protein, ur: 100 mg/dL — AB
SPECIFIC GRAVITY, URINE: 1.016 (ref 1.005–1.030)

## 2017-07-30 LAB — URINALYSIS, MICROSCOPIC (REFLEX)

## 2017-07-30 MED ORDER — CEPHALEXIN 500 MG PO CAPS: 500.0000 mg | ORAL_CAPSULE | Freq: Four times a day (QID) | ORAL | 0 refills | Status: AC

## 2017-07-30 MED ORDER — KETOROLAC TROMETHAMINE 60 MG/2ML IM SOLN
60.0000 mg | Freq: Once | INTRAMUSCULAR | Status: AC
  Administered 2017-07-30: 60 mg via INTRAMUSCULAR
  Filled 2017-07-30: qty 2

## 2017-07-30 MED ORDER — LIDOCAINE HCL (PF) 1 % IJ SOLN
INTRAMUSCULAR | Status: AC
  Filled 2017-07-30: qty 5

## 2017-07-30 MED ORDER — HYDROMORPHONE HCL 1 MG/ML IJ SOLN
2.0000 mg | Freq: Once | INTRAMUSCULAR | Status: AC
  Administered 2017-07-30: 2 mg via INTRAMUSCULAR
  Filled 2017-07-30: qty 2

## 2017-07-30 MED ORDER — CEFTRIAXONE SODIUM 1 G IJ SOLR
1.0000 g | Freq: Once | INTRAMUSCULAR | Status: AC
  Administered 2017-07-30: 1 g via INTRAMUSCULAR
  Filled 2017-07-30: qty 10

## 2017-07-30 NOTE — ED Provider Notes (Signed)
[facility] Note   CSN: [id number] Arrival date & time: 07/30/17  0132     History   Chief Complaint Chief Complaint  Patient presents with  . Flank Pain    HPI Erin Jefferson is a 26 y.o. female.  Patient is a 26 year old female with past medical history of renal calculi presenting with complaints of left flank pain.  She had a stone removed and a stent placed on Wednesday.  She was doing well until this evening when her pain returned.  She is complaining of severe pain in her left flank along with continued hematuria.  She denies any fevers or chills.  She denies any vomiting.   The history is provided by the patient.  Flank Pain  This is a new problem. The current episode started 3 to 5 hours ago. The problem occurs constantly. The problem has been rapidly worsening. Pertinent negatives include no abdominal pain. Nothing aggravates the symptoms. Nothing relieves the symptoms. Treatments tried: Percocet. The treatment provided no relief.    Past Medical History:  Diagnosis Date  . Frequency of urination   . GERD (gastroesophageal reflux disease)   . History of kidney stones   . Left ureteral stone   . Nephrolithiasis    bilateral nonobstructive per CT 06-28-2017  . Wears glasses     Patient Active Problem List   Diagnosis Date Noted  . 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 CESAREAN SECTION N/A 04/29/2013   Procedure: Primary cesarean section with delivery of baby boy at 1459. Apgars 9/9.;  Surgeon: Geryl RankinsEvelyn Varnado, MD;  Location: [facility];  Service: Obstetrics;  Laterality: N/A;  . CYSTOSCOPY WITH RETROGRADE PYELOGRAM, URETEROSCOPY AND STENT PLACEMENT Left 07/27/2017   Procedure: CYSTOSCOPY WITH LEFT RETROGRADE PYELOGRAM, LEFT URETEROSCOPY, LITHOTRIPSY AND STENT PLACEMENT;  Surgeon: Crista ElliotBell, Eugene D III, MD;  Location: [facility];  Service: Urology;   Laterality: Left;    OB History    Gravida Para Term Preterm AB Living   2 2 2     2    SAB TAB Ectopic Multiple Live Births           1       Home Medications    Prior to Admission medications   Medication Sig Start Date End Date Taking? Authorizing Provider  calcium carbonate (TUMS - DOSED IN MG ELEMENTAL CALCIUM) 500 MG chewable tablet Chew 1 tablet by mouth 3 (three) times daily as needed (for acid reflux/indigestion.).     [provider]  oxyCODONE-acetaminophen (PERCOCET/ROXICET) 5-325 MG tablet Take 1 tablet by mouth every 6 (six) hours as needed for severe pain. 06/28/17   Horton, Mayer Maskerourtney F, MD  tamsulosin (FLOMAX) 0.4 MG CAPS capsule Take 0.4 mg by mouth daily.     [provider]    Family History No family history on file.  Social History Social History   Tobacco Use  . Smoking status: Former Smoker    Years: 1.00    Types: Cigarettes    Last attempt to quit: 07/22/2009    Years since quitting: 8.0  . Smokeless tobacco: Never Used  Substance Use Topics  . Alcohol use: Yes    Comment: OCCASIONALLY  . Drug use: No     Allergies   Patient has no known allergies.   Review of Systems Review of Systems  Gastrointestinal: Negative for abdominal pain.  Genitourinary: Positive for flank pain.  All other systems reviewed and are negative.    Physical Exam Updated Vital Signs BP 112/64 (BP Location: Right Arm)   Pulse 76   Temp 98.4 F (36.9 C) (Oral)   Resp 16   Ht 5\' 3"  (1.6 m)   Wt 68 kg (150 lb)   SpO2 100%   BMI 26.57 kg/m   Physical Exam  Constitutional: She is oriented to person, place, and time. She appears well-developed and well-nourished. No distress.  Patient appears uncomfortable and cannot find a comfortable position.  HENT:  Head: Normocephalic and atraumatic.  Neck: Normal range of motion. Neck supple.  Cardiovascular: Normal rate and regular rhythm. Exam reveals no gallop and no friction rub.  No murmur  heard. Pulmonary/Chest: Effort normal and breath sounds normal. No respiratory distress. She has no wheezes.  Abdominal: Soft. Bowel sounds are normal. She exhibits no distension. There is no tenderness.  There is left-sided CVA tenderness noted.  Musculoskeletal: Normal range of motion.  Neurological: She is alert and oriented to person, place, and time.  Skin: Skin is warm and dry. She is not diaphoretic.  Nursing note and vitals reviewed.    ED Treatments / Results  Labs (all labs ordered are listed, but only abnormal results are displayed) Labs Reviewed  URINALYSIS, ROUTINE W REFLEX MICROSCOPIC    EKG  EKG Interpretation None       Radiology No results found.  Procedures Procedures (including critical care time)  Medications Ordered in ED Medications  ketorolac (TORADOL) injection 60 mg (not administered)  HYDROmorphone (DILAUDID) injection 2 mg (not administered)     Initial Impression / Assessment and Plan / ED Course  I have reviewed the triage vital signs and the nursing notes.  Pertinent labs & imaging results that were available during my care of the patient were reviewed by me and considered in my medical decision making (see chart for details).  Urinalysis reveals too numerous to count white cells.  I am uncertain as to whether or not this is inflammatory or truly infectious, however due to the increase in pain she will be given Rocephin and Keflex.  She is feeling better after pain medicine in the ER.  She will be discharged with Keflex and continued plan of care.  She is to have her stent removed on Monday as scheduled.  She does not appear septic, is not febrile, and appears clinically well.  Final Clinical Impressions(s) / ED Diagnoses   Final diagnoses:  None    ED Discharge Orders    None       Geoffery Lyonselo, Brooklynne Pereida, MD 07/30/17 [phone]

## 2017-07-30 NOTE — Discharge Instructions (Signed)
Keflex as prescribed.  Continue other medications as previously prescribed.  Stent removal on Monday as previously scheduled.  Return to the emergency department if you develop worsening pain, high fevers, or other new and concerning symptoms.

## 2017-07-30 NOTE — ED Triage Notes (Signed)
Pt c/o L flank pain onset 1300 yesterday, [facility] with [facility]. Pt states she is taking meds with no relief. Pt states she had relief after procedure until today.

## 2018-04-24 IMAGING — CT CT RENAL STONE PROTOCOL
2 of 4 series · 16 of 46 positions shown, 18 images · non-contrast
Comparison: None.

CLINICAL DATA: Acute onset of flank pain.  Initial encounter.

EXAM:
CT ABDOMEN AND PELVIS WITHOUT CONTRAST
TECHNIQUE: Multidetector CT imaging of the abdomen and pelvis was performed
following the standard protocol without IV contrast.

[Series 3: ap without · axial · non-contrast · 0.62mm/px · z∈[+719,+1099]mm · 13 of 85 slices shown, 15 images]
[im 5/85  soft-tissue]
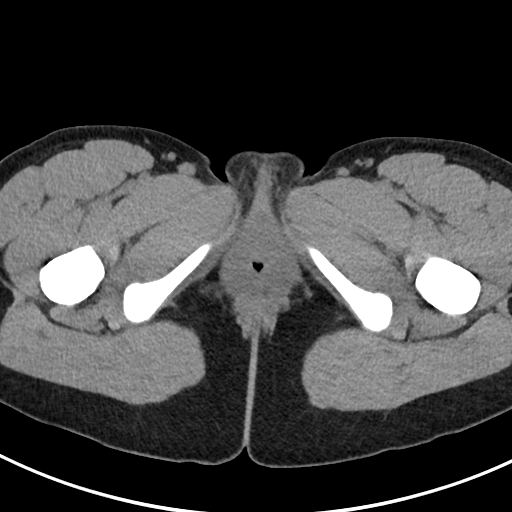
[im 5/85  bone]
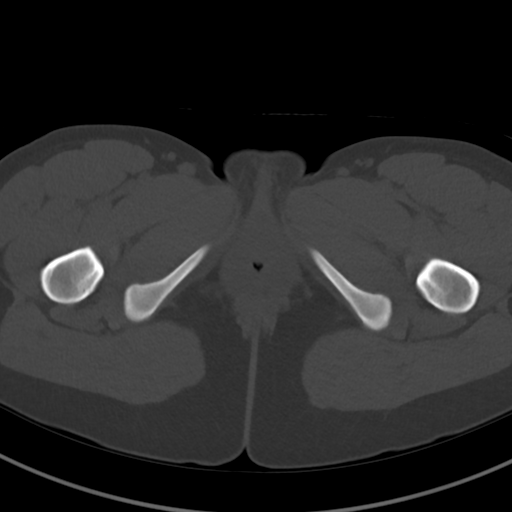
[im 13/85  soft-tissue]
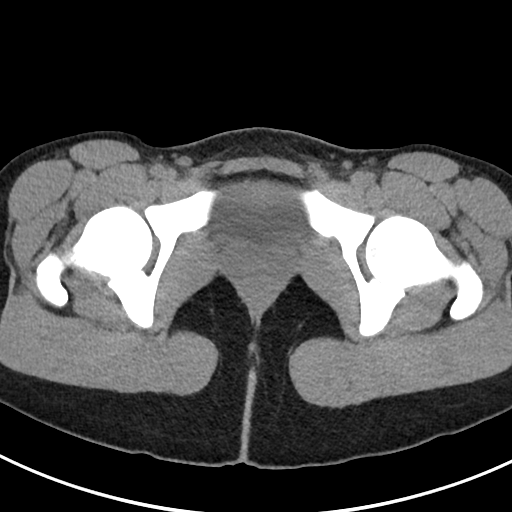
[im 17/85  soft-tissue]
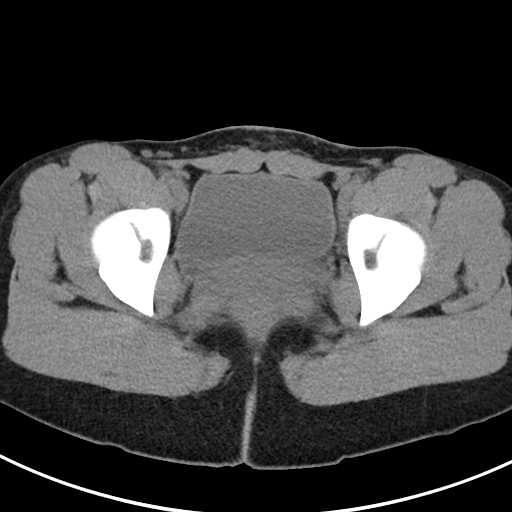
[im 25/85  soft-tissue]
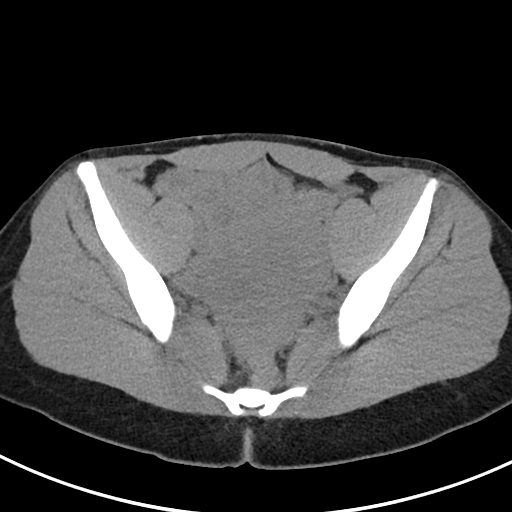
[im 29/85  soft-tissue]
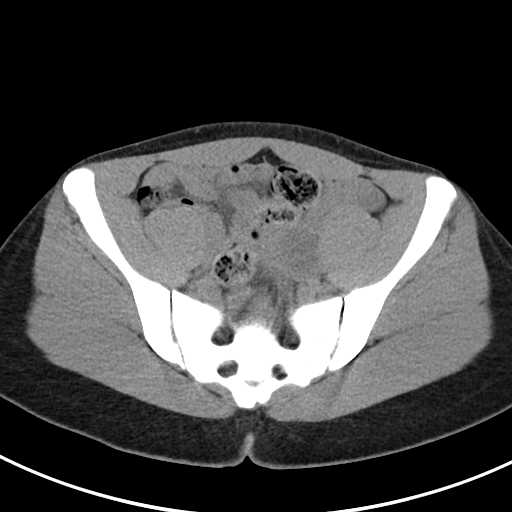
[im 37/85  soft-tissue]
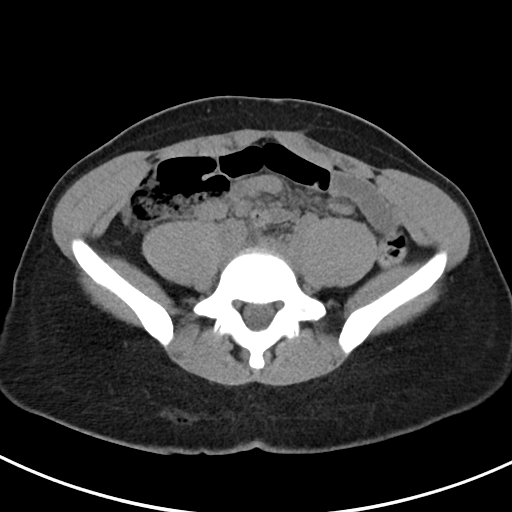
[im 45/85  soft-tissue]
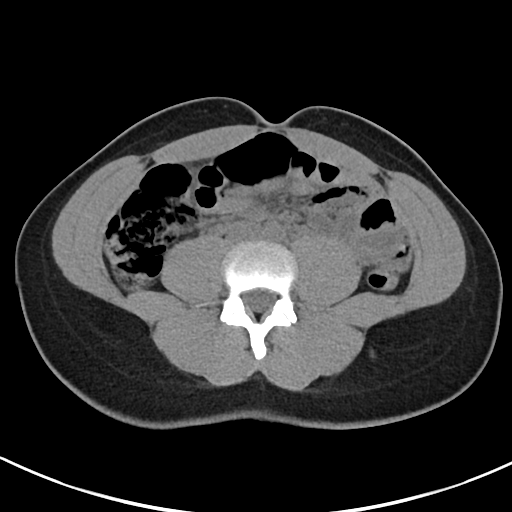
[im 49/85  soft-tissue]
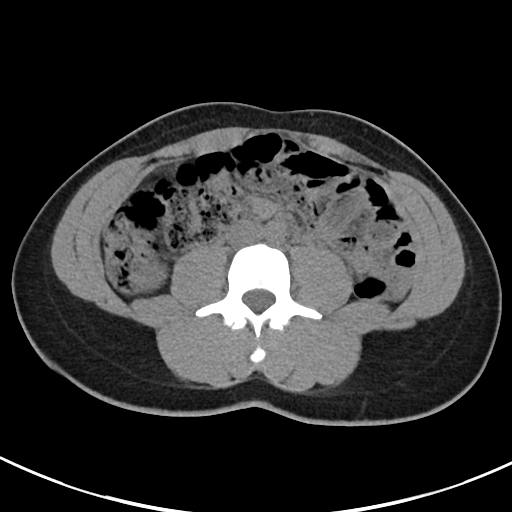
[im 57/85  soft-tissue]
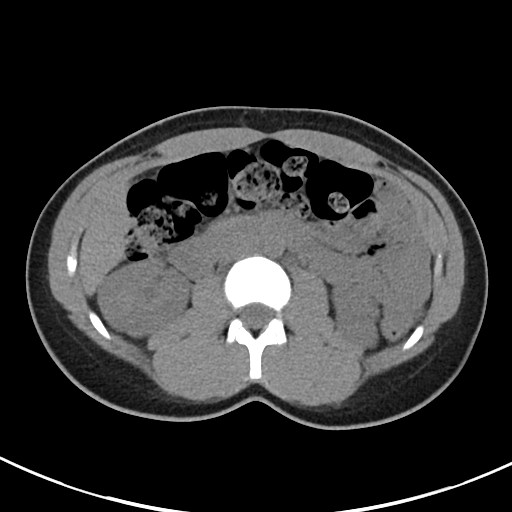
[im 57/85  bone]
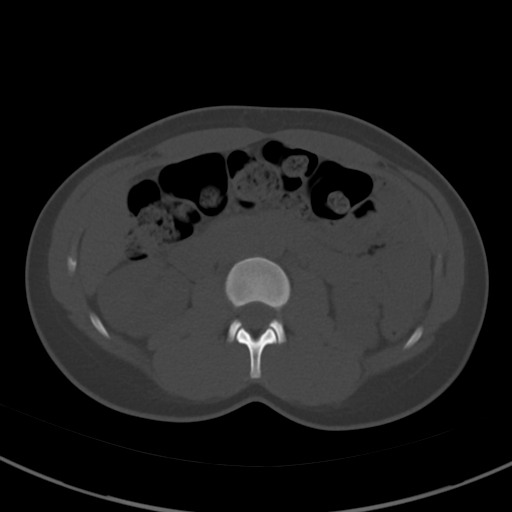
[im 61/85  soft-tissue]
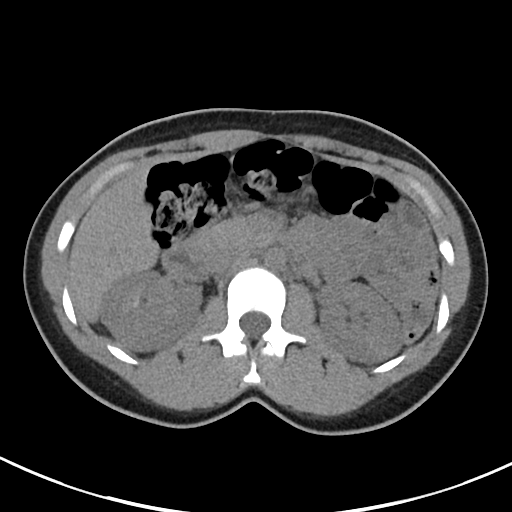
[im 69/85  soft-tissue]
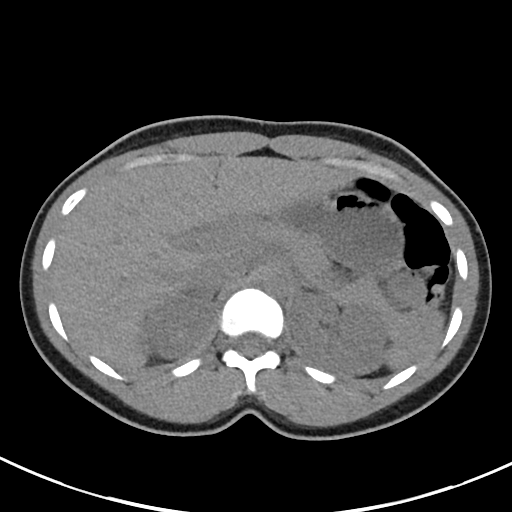
[im 73/85  soft-tissue]
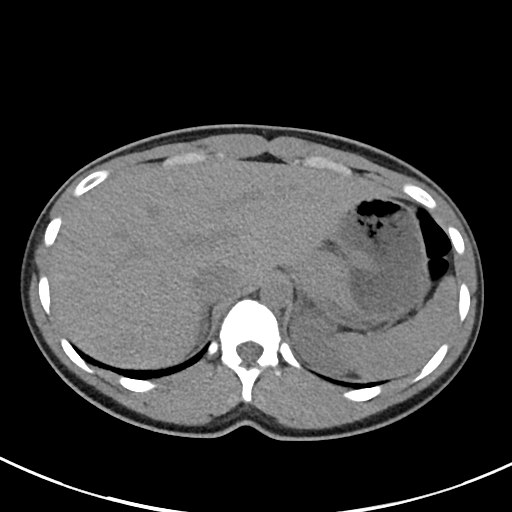
[im 81/85  soft-tissue]
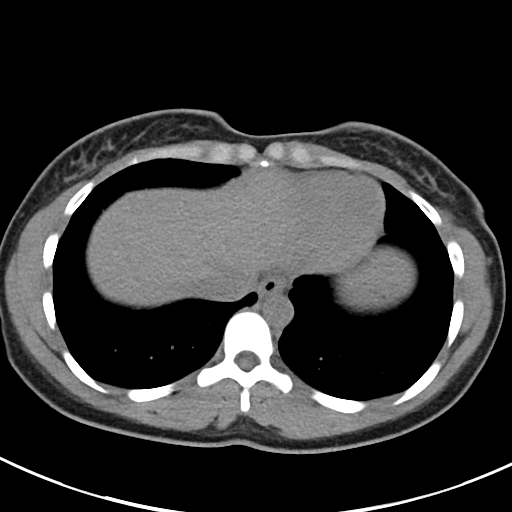

[Series 6: cor · coronal · 0.63mm/px · 3 of 80 slices shown]
[im 27/80  soft-tissue]
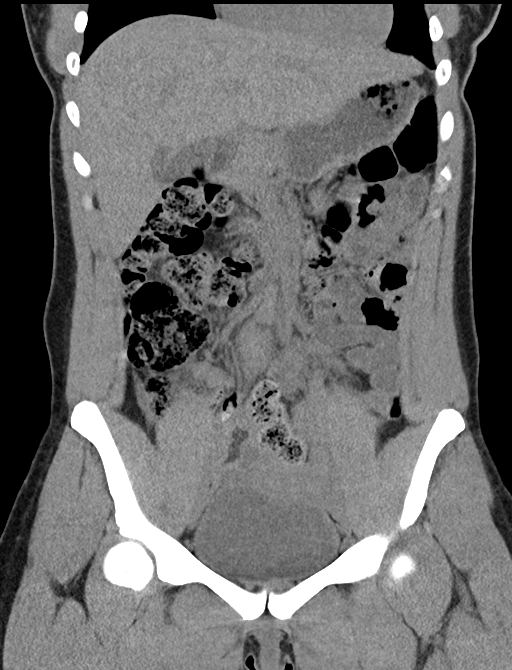
[im 36/80  soft-tissue]
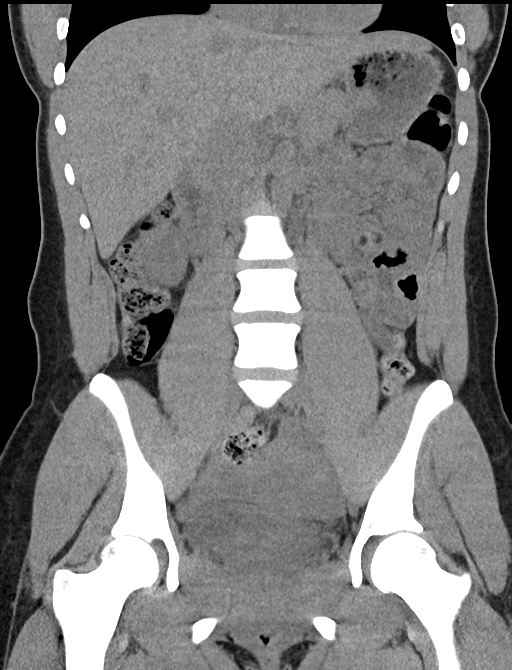
[im 44/80  soft-tissue]
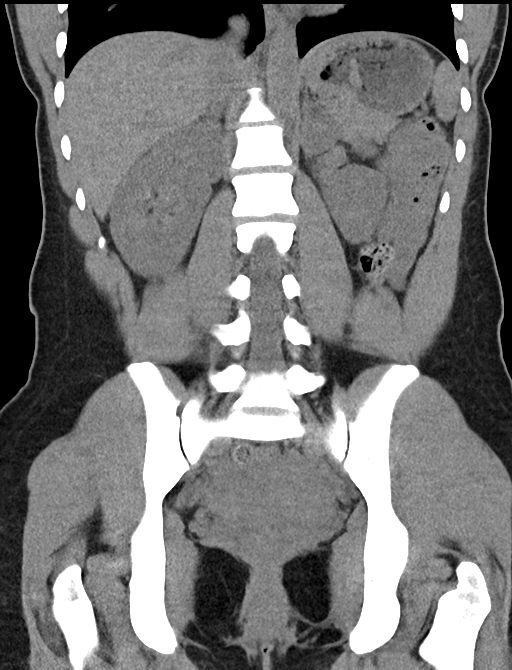

[16 of 46 positions shown; findings below may reference images not displayed]

FINDINGS: Lower chest: The visualized lung bases are grossly clear. The
visualized portions of the mediastinum are unremarkable.

Hepatobiliary: The liver is unremarkable in appearance. The
gallbladder is unremarkable in appearance. The common bile duct
remains normal in caliber.

Pancreas: The pancreas is within normal limits.

Spleen: The spleen is unremarkable in appearance.

Adrenals/Urinary Tract: The adrenal glands are unremarkable in
appearance.

Scattered small nonobstructing bilateral renal stones measure up to
3 mm in size. There is question of mild left renal pelvicaliectasis.
A 3 mm stone is suspected at the distal left ureter, just above the
left vesicoureteral junction. No perinephric stranding is seen.

Stomach/Bowel: The stomach is unremarkable in appearance. The small
bowel is within normal limits. The appendix is normal in caliber,
without evidence of appendicitis. The colon is unremarkable in
appearance.

Vascular/Lymphatic: The abdominal aorta is unremarkable in
appearance. The inferior vena cava is grossly unremarkable. No
retroperitoneal lymphadenopathy is seen. No pelvic sidewall
lymphadenopathy is identified.

Reproductive: The bladder is mildly distended and grossly
unremarkable. The uterus is grossly unremarkable in appearance. The
ovaries are relatively symmetric, though not well assessed without
contrast. No suspicious adnexal masses are seen.

Other: No additional soft tissue abnormalities are seen.

Musculoskeletal: No acute osseous abnormalities are identified. The
visualized musculature is unremarkable in appearance.
IMPRESSION: 1. Suspect 3 mm obstructing stone at the distal left ureter, just
above the left vesicoureteral junction. Question of mild left renal
pelvicaliectasis, without significant hydronephrosis.
2. Scattered small nonobstructing bilateral renal stones measure up
to 3 mm in size.

## 2020-04-02 ENCOUNTER — Ambulatory Visit: Payer: Medicaid Other | Attending: Internal Medicine

## 2020-04-02 DIAGNOSIS — Z20822 Contact with and (suspected) exposure to covid-19: Secondary | ICD-10-CM | POA: Insufficient documentation

## 2020-04-03 LAB — SARS-COV-2, NAA 2 DAY TAT

## 2020-04-03 LAB — NOVEL CORONAVIRUS, NAA: SARS-CoV-2, NAA: NOT DETECTED

## 2020-05-08 ENCOUNTER — Other Ambulatory Visit: Payer: Medicaid Other

## 2020-05-08 ENCOUNTER — Other Ambulatory Visit: Payer: Self-pay

## 2020-05-08 DIAGNOSIS — Z20822 Contact with and (suspected) exposure to covid-19: Secondary | ICD-10-CM

## 2020-05-10 LAB — NOVEL CORONAVIRUS, NAA: SARS-CoV-2, NAA: NOT DETECTED

## 2020-05-15 ENCOUNTER — Other Ambulatory Visit: Payer: Medicaid Other
